# Patient Record
Sex: Male | Born: 1980 | Race: White | Hispanic: No | Smoking: Current every day smoker
Health system: Southern US, Community
[De-identification: ages and names within clinical notes are randomized; demographics above are authoritative.]

## PROBLEM LIST (undated history)

## (undated) DIAGNOSIS — S39012A Strain of muscle, fascia and tendon of lower back, initial encounter: Secondary | ICD-10-CM

## (undated) DIAGNOSIS — F4323 Adjustment disorder with mixed anxiety and depressed mood: Secondary | ICD-10-CM

## (undated) DIAGNOSIS — T50901A Poisoning by unspecified drugs, medicaments and biological substances, accidental (unintentional), initial encounter: Secondary | ICD-10-CM

## (undated) HISTORY — DX: Strain of muscle, fascia and tendon of lower back, initial encounter: S39.012A

## (undated) HISTORY — PX: KNEE SURGERY: SHX244

## (undated) HISTORY — DX: Poisoning by unspecified drugs, medicaments and biological substances, accidental (unintentional), initial encounter: T50.901A

## (undated) HISTORY — DX: Adjustment disorder with mixed anxiety and depressed mood: F43.23

---
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
## 1998-01-28 ENCOUNTER — Emergency Department (HOSPITAL_COMMUNITY): Admission: EM | Admit: 1998-01-28 | Discharge: 1998-01-28 | Payer: Self-pay | Admitting: Emergency Medicine

## 1998-04-12 ENCOUNTER — Inpatient Hospital Stay (HOSPITAL_COMMUNITY): Admission: EM | Admit: 1998-04-12 | Discharge: 1998-04-16 | Payer: Self-pay

## 1998-04-13 ENCOUNTER — Encounter: Payer: Self-pay | Admitting: Internal Medicine

## 1998-04-16 ENCOUNTER — Encounter: Payer: Self-pay | Admitting: Internal Medicine

## 1998-07-05 ENCOUNTER — Encounter: Payer: Self-pay | Admitting: Internal Medicine

## 1998-11-17 ENCOUNTER — Encounter: Payer: Self-pay | Admitting: Emergency Medicine

## 1998-11-17 ENCOUNTER — Emergency Department (HOSPITAL_COMMUNITY): Admission: EM | Admit: 1998-11-17 | Discharge: 1998-11-17 | Payer: Self-pay | Admitting: Emergency Medicine

## 1999-05-02 ENCOUNTER — Encounter: Payer: Self-pay | Admitting: Orthopedic Surgery

## 1999-05-02 ENCOUNTER — Ambulatory Visit (HOSPITAL_COMMUNITY): Admission: RE | Admit: 1999-05-02 | Discharge: 1999-05-02 | Payer: Self-pay | Admitting: Orthopedic Surgery

## 1999-05-04 ENCOUNTER — Emergency Department (HOSPITAL_COMMUNITY): Admission: EM | Admit: 1999-05-04 | Discharge: 1999-05-04 | Payer: Self-pay | Admitting: Emergency Medicine

## 1999-07-18 ENCOUNTER — Emergency Department (HOSPITAL_COMMUNITY): Admission: EM | Admit: 1999-07-18 | Discharge: 1999-07-18 | Payer: Self-pay | Admitting: Emergency Medicine

## 2000-07-16 ENCOUNTER — Emergency Department (HOSPITAL_COMMUNITY): Admission: EM | Admit: 2000-07-16 | Discharge: 2000-07-17 | Payer: Self-pay | Admitting: Emergency Medicine

## 2000-10-05 ENCOUNTER — Emergency Department (HOSPITAL_COMMUNITY): Admission: EM | Admit: 2000-10-05 | Discharge: 2000-10-05 | Payer: Self-pay | Admitting: Emergency Medicine

## 2000-10-05 ENCOUNTER — Encounter: Payer: Self-pay | Admitting: Emergency Medicine

## 2000-10-28 ENCOUNTER — Emergency Department (HOSPITAL_COMMUNITY): Admission: EM | Admit: 2000-10-28 | Discharge: 2000-10-28 | Payer: Self-pay | Admitting: Emergency Medicine

## 2000-10-30 ENCOUNTER — Emergency Department (HOSPITAL_COMMUNITY): Admission: EM | Admit: 2000-10-30 | Discharge: 2000-10-30 | Payer: Self-pay | Admitting: Emergency Medicine

## 2002-04-24 ENCOUNTER — Emergency Department (HOSPITAL_COMMUNITY): Admission: EM | Admit: 2002-04-24 | Discharge: 2002-04-24 | Payer: Self-pay

## 2002-09-06 ENCOUNTER — Emergency Department (HOSPITAL_COMMUNITY): Admission: EM | Admit: 2002-09-06 | Discharge: 2002-09-06 | Payer: Self-pay | Admitting: Emergency Medicine

## 2002-11-25 ENCOUNTER — Emergency Department (HOSPITAL_COMMUNITY): Admission: AD | Admit: 2002-11-25 | Discharge: 2002-11-25 | Payer: Self-pay | Admitting: Emergency Medicine

## 2003-05-27 ENCOUNTER — Emergency Department (HOSPITAL_COMMUNITY): Admission: EM | Admit: 2003-05-27 | Discharge: 2003-05-27 | Payer: Self-pay | Admitting: *Deleted

## 2003-10-18 ENCOUNTER — Emergency Department (HOSPITAL_COMMUNITY): Admission: EM | Admit: 2003-10-18 | Discharge: 2003-10-18 | Payer: Self-pay | Admitting: Emergency Medicine

## 2004-06-20 ENCOUNTER — Emergency Department (HOSPITAL_COMMUNITY): Admission: EM | Admit: 2004-06-20 | Discharge: 2004-06-20 | Payer: Self-pay | Admitting: Emergency Medicine

## 2004-09-14 ENCOUNTER — Emergency Department (HOSPITAL_COMMUNITY): Admission: EM | Admit: 2004-09-14 | Discharge: 2004-09-15 | Payer: Self-pay | Admitting: Emergency Medicine

## 2004-11-14 ENCOUNTER — Emergency Department (HOSPITAL_COMMUNITY): Admission: EM | Admit: 2004-11-14 | Discharge: 2004-11-14 | Payer: Self-pay | Admitting: Emergency Medicine

## 2005-08-04 ENCOUNTER — Emergency Department (HOSPITAL_COMMUNITY): Admission: EM | Admit: 2005-08-04 | Discharge: 2005-08-04 | Payer: Self-pay | Admitting: Family Medicine

## 2006-03-12 ENCOUNTER — Emergency Department (HOSPITAL_COMMUNITY): Admission: EM | Admit: 2006-03-12 | Discharge: 2006-03-12 | Payer: Self-pay | Admitting: Family Medicine

## 2007-05-08 ENCOUNTER — Emergency Department (HOSPITAL_COMMUNITY): Admission: EM | Admit: 2007-05-08 | Discharge: 2007-05-08 | Payer: Self-pay | Admitting: Emergency Medicine

## 2007-05-22 ENCOUNTER — Emergency Department (HOSPITAL_COMMUNITY): Admission: EM | Admit: 2007-05-22 | Discharge: 2007-05-22 | Payer: Self-pay | Admitting: Emergency Medicine

## 2007-09-29 ENCOUNTER — Ambulatory Visit (HOSPITAL_COMMUNITY): Admission: RE | Admit: 2007-09-29 | Discharge: 2007-09-29 | Payer: Self-pay | Admitting: Orthopedic Surgery

## 2008-01-18 ENCOUNTER — Emergency Department (HOSPITAL_COMMUNITY): Admission: EM | Admit: 2008-01-18 | Discharge: 2008-01-18 | Payer: Self-pay | Admitting: Emergency Medicine

## 2008-04-05 ENCOUNTER — Emergency Department: Payer: Self-pay | Admitting: Emergency Medicine

## 2008-04-16 ENCOUNTER — Emergency Department (HOSPITAL_COMMUNITY): Admission: EM | Admit: 2008-04-16 | Discharge: 2008-04-16 | Payer: Self-pay | Admitting: Emergency Medicine

## 2008-09-14 ENCOUNTER — Encounter: Admission: RE | Admit: 2008-09-14 | Discharge: 2008-09-14 | Payer: Self-pay | Admitting: Family Medicine

## 2008-10-15 ENCOUNTER — Emergency Department (HOSPITAL_COMMUNITY): Admission: EM | Admit: 2008-10-15 | Discharge: 2008-10-15 | Payer: Self-pay | Admitting: Emergency Medicine

## 2008-10-22 ENCOUNTER — Emergency Department (HOSPITAL_COMMUNITY): Admission: EM | Admit: 2008-10-22 | Discharge: 2008-10-22 | Payer: Self-pay | Admitting: Emergency Medicine

## 2008-10-25 ENCOUNTER — Emergency Department (HOSPITAL_COMMUNITY): Admission: EM | Admit: 2008-10-25 | Discharge: 2008-10-25 | Payer: Self-pay | Admitting: *Deleted

## 2009-03-04 ENCOUNTER — Ambulatory Visit: Payer: Self-pay | Admitting: Diagnostic Radiology

## 2009-03-04 ENCOUNTER — Emergency Department (HOSPITAL_BASED_OUTPATIENT_CLINIC_OR_DEPARTMENT_OTHER): Admission: EM | Admit: 2009-03-04 | Discharge: 2009-03-04 | Payer: Self-pay | Admitting: Emergency Medicine

## 2009-05-05 ENCOUNTER — Inpatient Hospital Stay (HOSPITAL_COMMUNITY): Admission: RE | Admit: 2009-05-05 | Discharge: 2009-05-06 | Payer: Self-pay | Admitting: Psychiatry

## 2009-05-05 ENCOUNTER — Encounter: Payer: Self-pay | Admitting: Emergency Medicine

## 2009-05-05 ENCOUNTER — Ambulatory Visit: Payer: Self-pay | Admitting: Psychiatry

## 2009-09-11 ENCOUNTER — Emergency Department (HOSPITAL_COMMUNITY): Admission: EM | Admit: 2009-09-11 | Discharge: 2009-09-11 | Payer: Self-pay | Admitting: Emergency Medicine

## 2009-09-14 ENCOUNTER — Emergency Department (HOSPITAL_COMMUNITY): Admission: EM | Admit: 2009-09-14 | Discharge: 2009-09-14 | Payer: Self-pay | Admitting: Emergency Medicine

## 2009-09-23 ENCOUNTER — Ambulatory Visit: Payer: Self-pay | Admitting: Diagnostic Radiology

## 2009-09-23 ENCOUNTER — Emergency Department (HOSPITAL_BASED_OUTPATIENT_CLINIC_OR_DEPARTMENT_OTHER): Admission: EM | Admit: 2009-09-23 | Discharge: 2009-09-23 | Payer: Self-pay | Admitting: Emergency Medicine

## 2009-12-29 ENCOUNTER — Emergency Department (HOSPITAL_COMMUNITY): Admission: EM | Admit: 2009-12-29 | Discharge: 2009-12-29 | Payer: Self-pay | Admitting: Family Medicine

## 2010-01-04 ENCOUNTER — Emergency Department (HOSPITAL_COMMUNITY): Admission: EM | Admit: 2010-01-04 | Discharge: 2010-01-04 | Payer: Self-pay | Admitting: Emergency Medicine

## 2010-05-05 ENCOUNTER — Ambulatory Visit: Payer: Self-pay | Admitting: Internal Medicine

## 2010-05-05 ENCOUNTER — Encounter: Payer: Self-pay | Admitting: Internal Medicine

## 2010-05-05 DIAGNOSIS — M79609 Pain in unspecified limb: Secondary | ICD-10-CM

## 2010-05-05 DIAGNOSIS — M25519 Pain in unspecified shoulder: Secondary | ICD-10-CM

## 2010-05-05 DIAGNOSIS — J301 Allergic rhinitis due to pollen: Secondary | ICD-10-CM

## 2010-05-05 DIAGNOSIS — F4323 Adjustment disorder with mixed anxiety and depressed mood: Secondary | ICD-10-CM

## 2010-05-06 ENCOUNTER — Telehealth: Payer: Self-pay | Admitting: Licensed Clinical Social Worker

## 2010-05-20 ENCOUNTER — Telehealth: Payer: Self-pay | Admitting: Internal Medicine

## 2010-05-20 ENCOUNTER — Encounter: Payer: Self-pay | Admitting: Internal Medicine

## 2010-05-20 ENCOUNTER — Ambulatory Visit
Admission: RE | Admit: 2010-05-20 | Discharge: 2010-05-20 | Payer: Self-pay | Source: Home / Self Care | Attending: Internal Medicine | Admitting: Internal Medicine

## 2010-05-20 DIAGNOSIS — T50901A Poisoning by unspecified drugs, medicaments and biological substances, accidental (unintentional), initial encounter: Secondary | ICD-10-CM | POA: Insufficient documentation

## 2010-05-20 LAB — CONVERTED CEMR LAB: Acetaminophen (Tylenol), Serum: <10 ug/mL — ABNORMAL LOW (ref 10–30)

## 2010-05-21 ENCOUNTER — Encounter: Payer: Self-pay | Admitting: Internal Medicine

## 2010-05-22 ENCOUNTER — Ambulatory Visit: Admission: RE | Admit: 2010-05-22 | Discharge: 2010-05-22 | Payer: Self-pay | Source: Home / Self Care

## 2010-06-10 ENCOUNTER — Telehealth: Payer: Self-pay | Admitting: Licensed Clinical Social Worker

## 2010-06-18 ENCOUNTER — Ambulatory Visit: Admission: RE | Admit: 2010-06-18 | Discharge: 2010-06-18 | Payer: Self-pay | Source: Home / Self Care

## 2010-06-18 DIAGNOSIS — S335XXA Sprain of ligaments of lumbar spine, initial encounter: Secondary | ICD-10-CM | POA: Insufficient documentation

## 2010-06-20 ENCOUNTER — Telehealth: Payer: Self-pay | Admitting: Internal Medicine

## 2010-06-20 ENCOUNTER — Ambulatory Visit: Admit: 2010-06-20 | Payer: Self-pay

## 2010-06-26 NOTE — Assessment & Plan Note (Signed)
Summary: NEW PT TO CLINIC AND MD/CH   Vital Signs:  Patient profile:   30 year old male Height:      66 inches Weight:      184.1 pounds (83.68 kg) BMI:     29.82 Temp:     96.9 degrees F (36.06 degrees C) oral Pulse rate:   93 / minute BP sitting:   117 / 69  Vitals Entered By: Cynda Familia Duncan Dull) (May 05, 2010 1:32 PM) CC: new to clinic Pain Assessment Patient in pain? yes     Location: left shoulder Intensity: 6 Type: aching Onset of pain  long time Nutritional Status BMI of 25 - 29 = overweight Nutritional Status Detail appetite varies  Have you ever been in a relationship where you felt threatened, hurt or afraid?No   Does patient need assistance? Functional Status Self care Ambulation Normal   CC:  new to clinic.  History of Present Illness: 30 yo man without significant PMH presents today for anxiety.  He is under a lot of stress, has 5 kids of his own and has to take care of his father, grandfather, and sister. His father has lung cancer. He ruptured his tendon on his left leg back in august 2011 and he evaluated by Dr. Brynda Greathouse once and was told to come back to see physical therapy and also has chronic his left shoulder pain.  Patient's job requires him to lift his arm therefore he damaged his tendons.  He was supposed to return for an MRI but again stated that he did not have time.  He has been taken aleve, goody powder, asprin with minimal  relief.  He only gets 4-5 hours of sleep per night.   At times he feels depressed, denies SI/HI, he does want to sleep and feel tired all the time, low energy level.   Depression History:      The patient denies a depressed mood most of the day and a diminished interest in his usual daily activities.         Preventive Screening-Counseling & Management  Alcohol-Tobacco     Smoking Status: current     Smoking Cessation Counseling: yes     Packs/Day: 0.5  Allergies (verified): No Known Drug Allergies  Social  History: Smoking Status:  current Packs/Day:  0.5  Physical Exam  General:  alert, well-developed, well-nourished, and well-hydrated.   Nose:  clear nasal discharge and mild erythema of nasal mucosa Lungs:  normal respiratory effort, no intercostal retractions, no accessory muscle use, normal breath sounds, no crackles, and no wheezes.   Heart:  normal rate, regular rhythm, no murmur, no gallop, no rub, and no JVD.   Abdomen:  soft, non-tender, normal bowel sounds, no distention, no masses, no guarding, and no rebound tenderness.   Msk:  left shoulder:+ crepitus, tenderness, FROM left knee: tenderness on palpation, full ROM, no effusion on knee joint Pulses:  R and L carotid,radial,femoral,dorsalis pedis and posterior tibial pulses are full and equal bilaterally Extremities:  No clubbing, cyanosis, edema, or deformity noted with normal full range of motion of all joints.   Neurologic:  alert & oriented X3, cranial nerves II-XII intact, strength normal in all extremities, and sensation intact to light touch.   Psych:  moderately anxious and judgment fair.  Denies any SI/HI   Impression & Recommendations:  Problem # 1:  ADJ DISORDER WITH MIXED ANXIETY & DEPRESSED MOOD (ICD-309.28) Patient is going through a lot of stress in his life right  now given that he has to take care of his father who has lung cancer and his grandfather, sister, and 5 children of his own.  Patient does not want to be on medications long-term.  He said he tried Paxil and Prozac in the past without any success.   Will prescribe Celexa 20mg  by mouth once daily. Will start Klonopin 0.5mg  by mouth once daily  Will refer to social worker for help with social issues  Orders: Social Work Referral (Social )  Problem # 2:  SHOULDER PAIN, LEFT (ICD-719.41) Will refer patient back to sport medicine, Dr. Brynda Greathouse. He said he was given Vicodin by Dr. Brynda Greathouse but we will prescribe Mobic 15mg  by mouth once daily.  Will not give  narcotics at this time.  I would like for patient to try a trial of NSAIDs first.      His updated medication list for this problem includes:    Mobic 15 Mg Tabs (Meloxicam) .[name]... Take 1 tablet by mouth for pain  Problem # 3:  LEG PAIN, LEFT (ICD-729.5) Will prescribe Mobic for his pain while waiting to see Dr. Brynda Greathouse.  He has already seen sport medicine in the past and he cancelled his appointments because he did not have time.   I advised him to go back for treatment with Dr. Brynda Greathouse.  Orders: Social Work Referral (Social )  Problem # 4:  Preventive Health Care (ICD-V70.0) Flu vaccine given  today  Problem # 5:  ALLERGIC RHINITIS, SEASONAL (ICD-477.0) Runny nose, erythematous nasal mucosa Will refill Flonase  Complete Medication List: 1)  Celexa 20 Mg Tabs (Citalopram hydrobromide) .... Take 1 tablet by mouth daily for depression and anxiety 2)  Mobic 15 Mg Tabs (Meloxicam) .... Take 1 tablet by mouth for pain 3)  Klonopin 0.5 Mg Tabs (Clonazepam) .... Take 1 tablet by mouth once daily for anxiety 4)  Flonase 50 Mcg/act Susp (Fluticasone propionate) .... 2 sprays in each nostril daily for allergy/runny nose/congestion  Patient Instructions: 1)  Take Celexa 20mg  by mouth once daily for your depression and anxiety attacks, it will take several weeks for the medication to work, if you start feeling more depressed or suicidal, please stop the medication and call our office or come in to be evaluated 2)  Take Klonopin 0.5mg  by mouth once daily for your anxiety 3)  Take Mobic 15mg  by mouth once daily for your shoulder and leg pain 4)  Make sure you call and make appointment with Dr. Brynda Greathouse- Sport Medicine 5)  Will refer you to social work- Dorothe Pea will call you 6)  Please follow up in 2-4 weeks-[facility] Prescriptions: FLONASE 50 MCG/ACT SUSP (FLUTICASONE PROPIONATE) 2 sprays in each nostril daily for allergy/runny nose/congestion  #1 x 1   Entered and  Authorized by:   Rosana Berger MD   Signed by:   Rosana Berger MD on 05/05/2010   Method used:   Print then Give to Patient   RxID:   [id number] CELEXA 20 MG TABS (CITALOPRAM HYDROBROMIDE) take 1 tablet by mouth daily for depression and anxiety  #30 x 3   Entered and Authorized by:   Rosana Berger MD   Signed by:   Rosana Berger MD on 05/05/2010   Method used:   Print then Give to Patient   RxID:   [id number] MOBIC 15 MG TABS (MELOXICAM) take 1 tablet by mouth for pain  #30 x 1   Entered and Authorized by:   Rosana Berger MD  Signed by:   Rosana Berger MD on 05/05/2010   Method used:   Print then Give to Patient   RxID:   [id number] KLONOPIN 0.5 MG TABS (CLONAZEPAM) take 1 tablet by mouth once daily for anxiety  #30 x 1   Entered and Authorized by:   Rosana Berger MD   Signed by:   Rosana Berger MD on 05/05/2010   Method used:   Print then Give to Patient   RxID:   [id number] FLONASE 50 MCG/ACT SUSP (FLUTICASONE PROPIONATE) 2 sprays in each nostril daily for allergy/runny nose/congestion  #1 x 1   Entered and Authorized by:   Rosana Berger MD   Signed by:   Rosana Berger MD on 05/05/2010   Method used:   Electronically to        [facility]* (retail)       [address] [place], [place]  [address]       Ph: [phone] or [phone]       Fax: [phone]   RxID:   [id number] KLONOPIN 0.5 MG TABS (CLONAZEPAM) take 1 tablet by mouth once daily for anxiety  #30 x 1   Entered and Authorized by:   Rosana Berger MD   Signed by:   Rosana Berger MD on 05/05/2010   Method used:   Print then Give to Patient   RxID:   [id number] MOBIC 15 MG TABS (MELOXICAM) take 1 tablet by mouth for pain  #30 x 1   Entered and Authorized by:   Rosana Berger MD   Signed by:   Rosana Berger MD on 05/05/2010   Method used:   Electronically to        [facility]* (retail)       [address] [place], [place]  [address]       Ph: [phone] or [phone]       Fax: [phone]   RxID:   [id number] CELEXA 20 MG TABS (CITALOPRAM HYDROBROMIDE) take 1 tablet by mouth daily for depression and anxiety  #30 x 3   Entered and Authorized by:   Rosana Berger MD   Signed by:   Rosana Berger MD on 05/05/2010   Method used:   Electronically to        [facility]* (retail)       [address] [place], [place]  [address]       Ph: [phone] or [phone]       Fax: [phone]   RxID:   [id number]    Orders Added: 1)  Social Work Referral Lelon Mast ] 2)  Est. Patient Level III [phone]

## 2010-06-26 NOTE — Miscellaneous (Signed)
Summary: Pain medication  Clinical Lists Changes Reviewed labs. Liver function tests are normal and tylenol level neg.Will give him short course of vicodin for his chronic knee pain from a meniscus tear and refer him to orthopedics for management. Would like for him stop using Goody's powder immediately. I will add on an NSAID when I re-evaluate him.   Medications: Added new medication of VICODIN 5-500 MG TABS (HYDROCODONE-ACETAMINOPHEN) Take 1 tablet by mouth every 6 hours as needed for pain - Signed Added new medication of VOLTAREN 1 % GEL (DICLOFENAC SODIUM) Apply to painful joints as directed two times a day - Signed Rx of VICODIN 5-500 MG TABS (HYDROCODONE-ACETAMINOPHEN) Take 1 tablet by mouth every 6 hours as needed for pain;  #60 x 0;  Signed;  Entered by: Julaine Fusi  DO;  Authorized by: Julaine Fusi  DO;  Method used: Telephoned to [facility]*, [address], [place], [place]  [address], Ph: [phone] or [phone], Fax: [phone] Rx of VOLTAREN 1 % GEL (DICLOFENAC SODIUM) Apply to painful joints as directed two times a day;  #1 tube x 3;  Signed;  Entered by: Julaine Fusi  DO;  Authorized by: Julaine Fusi  DO;  Method used: Telephoned to [facility]*, [address], [place], [place]  [address], Ph: [phone] or [phone], Fax: [phone]    Prescriptions: VOLTAREN 1 % GEL (DICLOFENAC SODIUM) Apply to painful joints as directed two times a day  #1 tube x 3   Entered and Authorized by:   Julaine Fusi  DO   Signed by:   Julaine Fusi  DO on 05/21/2010   Method used:   Telephoned to ...       [facility]* (retail)       [address] [place], [place]  [address]       Ph: [phone] or [phone]       Fax: [phone]   RxID:   [id number] VICODIN 5-500 MG TABS (HYDROCODONE-ACETAMINOPHEN) Take 1 tablet by  mouth every 6 hours as needed for pain  #60 x 0   Entered and Authorized by:   Julaine Fusi  DO   Signed by:   Julaine Fusi  DO on 05/21/2010   Method used:   Telephoned to ...       [facility]* (retail)       [address] [place], [place]  [address]       Ph: [phone] or [phone]       Fax: [phone]   RxID:   [id number]  Rx called in Merrie Roof RN  May 21, 2010 12:03 PM

## 2010-06-26 NOTE — Progress Notes (Addendum)
Summary: Soc. Worl F/U  Phone Note [employer] of Call: Phone counseling.  30 minutes.  Call to check in with Maisie Fus and make sure he is okay coping with his father's hospitalizations and terminal illness.  Listened to patient's story.  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  We discussed [employer] and how to approach getting his father to agree to go there.  Praised patient for assisting his father as best he can as father deals with lung cancer.   Patient said he is doing much better since getting off antidepressants.   Father is at [facility] right now and he and his sister will try to get him to go to [facility] because he had a fall at home last week. Daichi said his father is very stubborn and wants to be at home but they can no longer manage his care and it is dangerous and overwhelming.   Channin said the doctor's have told his father that the cancer has spread.  Ramone said that his father is still eating and smoking at home.   Gave pt my hours and encouraged him to call me for advice and problem-solving.   I told him we will remain in touch.

## 2010-06-26 NOTE — Progress Notes (Signed)
Summary: Soc. Work  Programme researcher, [employer] of Call: Spoke with Holley about coming in for appmt to visit with me.   He will call me back.  He thinks his dad has appmt on Thursday and that may be a good time.

## 2010-06-26 NOTE — Assessment & Plan Note (Signed)
Summary: depression/gg   Vital Signs:  Patient profile:   30 year old male Height:      66 inches (167.64 cm) Weight:      178.3 pounds (81.05 kg) BMI:     28.88 Temp:     97.5 degrees F (36.39 degrees C) oral Pulse rate:   98 / minute BP sitting:   118 / 79  (left arm)  Vitals Entered By: Stanton Kidney Ditzler RN (May 22, 2010 2:18 PM) Is Patient Diabetic? No Pain Assessment Patient in pain? no      Nutritional Status BMI of 25 - 29 = overweight Nutritional Status Detail appetite fair  Have you ever been in a relationship where you felt threatened, hurt or afraid?denies   Does patient need assistance? Functional Status Self care Ambulation Normal Comments Talk - father wants to go home. Tried of fighting the system.   History of Present Illness: 30 yo male with PMH outlined below present to [facility] with main concern of anxiety given his difficult situation at home with his father who is dying. He has been on antidepressant but reports not tolerating it welll and has tries to overdose on it. He is tolerating valium well and would like to get refill. No SI or HI.   Depression History:      The patient denies a depressed mood most of the day and a diminished interest in his usual daily activities.  The patient denies significant weight loss, significant weight gain, insomnia, hypersomnia, psychomotor agitation, psychomotor retardation, fatigue (loss of energy), feelings of worthlessness (guilt), impaired concentration (indecisiveness), and recurrent thoughts of death or suicide.        The patient denies that he feels like life is not worth living, denies that he wishes that he were dead, and denies that he has thought about ending his life.         Preventive Screening-Counseling & Management  Alcohol-Tobacco     Smoking Status: current     Smoking Cessation Counseling: yes     Packs/Day: 0.5  Problems Prior to Update: 1)  Drug Overdose  (ICD-977.9) 2)  Allergic  Rhinitis, Seasonal  (ICD-477.0) 3)  Leg Pain, Left  (ICD-729.5) 4)  Shoulder Pain, Left  (ICD-719.41) 5)  Adj Disorder With Mixed Anxiety & Depressed Mood  (ICD-309.28)  Medications Prior to Update: 1)  Flonase 50 Mcg/act Susp (Fluticasone Propionate) .... 2 Sprays in Each Nostril Daily For Allergy/runny Nose/congestion 2)  Valium 5 Mg Tabs (Diazepam) .... Take 1 Tablet By Mouth Two Times A Day As Needed For Severe Anxiety and Agitation 3)  Vicodin 5-500 Mg Tabs (Hydrocodone-Acetaminophen) .... Take 1 Tablet By Mouth Every 6 Hours As Needed For Pain 4)  Voltaren 1 % Gel (Diclofenac Sodium) .... Apply To Painful Joints As Directed Two Times A Day  Current Medications (verified): 1)  Flonase 50 Mcg/act Susp (Fluticasone Propionate) .... 2 Sprays in Each Nostril Daily For Allergy/runny Nose/congestion 2)  Valium 5 Mg Tabs (Diazepam) .... Take 1 Tablet By Mouth Two Times A Day As Needed For Severe Anxiety and Agitation 3)  Vicodin 5-500 Mg Tabs (Hydrocodone-Acetaminophen) .... Take 1 Tablet By Mouth Every 6 Hours As Needed For Pain 4)  Voltaren 1 % Gel (Diclofenac Sodium) .... Apply To Painful Joints As Directed Two Times A Day  Allergies: 1)  ! Celexa (Citalopram Hydrobromide)  Past History:  Risk Factors: Smoking Status: current (05/22/2010) Packs/Day: 0.5 (05/22/2010)  Review of Systems  per HPI  Physical Exam  General:  Well-developed,well-nourished,in no acute distress; alert,appropriate and cooperative throughout examination Lungs:  normal respiratory effort, no intercostal retractions, no accessory muscle use, normal breath sounds, no crackles, and no wheezes.   Heart:  normal rate, regular rhythm, no murmur, no gallop, no rub, and no JVD.   Psych:  Cognition and judgment appear intact. Alert and cooperative with normal attention span and concentration. No apparent delusions, illusions, hallucinations   Impression & Recommendations:  Problem # 1:  ADJ DISORDER WITH  MIXED ANXIETY & DEPRESSED MOOD (ICD-309.28) Discussed with Dr. Phillips Odor and will not continue antidepressant medicaiton but will allow refill on valium and will cont to follow up on control of his anxiety. I have spent an hour consouling the pt about going thrpugh the difficult time with his father.   Complete Medication List: 1)  Flonase 50 Mcg/act Susp (Fluticasone propionate) .... 2 sprays in each nostril daily for allergy/runny nose/congestion 2)  Vicodin 5-500 Mg Tabs (Hydrocodone-acetaminophen) .... Take 1 tablet by mouth every 6 hours as needed for pain 3)  Voltaren 1 % Gel (Diclofenac sodium) .... Apply to painful joints as directed two times a day  Patient Instructions: 1)  Please schedule a follow-up appointment in 6 months. Prescriptions: VICODIN 5-500 MG TABS (HYDROCODONE-ACETAMINOPHEN) Take 1 tablet by mouth every 6 hours as needed for pain  #60 x 0   Entered and Authorized by:   Mliss Sax MD   Signed by:   Mliss Sax MD on 05/22/2010   Method used:   Print then Give to Patient   RxID:   [id number] VALIUM 5 MG TABS (DIAZEPAM) Take 1 tablet by mouth two times a day as needed for severe anxiety and agitation  #60 x 0   Entered and Authorized by:   Mliss Sax MD   Signed by:   Mliss Sax MD on 05/22/2010   Method used:   Print then Give to Patient   RxID:   [id number]    Orders Added: 1)  Est. Patient Level II [47425]   Immunization History:  Influenza Immunization History:    Influenza:  state fluvax 3 (05/05/2010)   Immunization History:  Influenza Immunization History:    Influenza:  State Fluvax 3 (05/05/2010)   Prevention & Chronic Care Immunizations   Influenza vaccine: State Fluvax 3  (05/05/2010)    Tetanus booster: Not documented    Pneumococcal vaccine: Not documented  Other Screening   Smoking status: current  (05/22/2010)   Smoking cessation counseling: yes  (05/22/2010)

## 2010-06-26 NOTE — Progress Notes (Signed)
Summary: walk-in/gg  Phone Note Call from Patient   Caller: Patient Summary of Call: Pt walked into clinic stating the meds he is on is making him angery.  He states he tried to kill himself a few nights ago by taking tylenol.  He ended up vomiting after that.  He is depressed because his Dad is dying. He feels vicodin and valium helps him much more that all the meds he was put on. He now feels rage.  Called Dr Phillips Odor in to talk with pt.  Labs drawn, Rx given per Dr Phillips Odor.  Pt will return for f/u on Thursday.  He was given contact #'s for Resident on call and [facility]  When d/c pt states he feels much better, no thoughts of suicide or harming others.  ( 1700) Initial call taken by: Merrie Roof RN,  May 20, 2010 4:19 PM  Follow-up for Phone Call        Spoke with patient at length today regarding his depression. Worsening symptoms when he started Celexa- became suicidal. Last dose of Celexa was 12/24. I advised him to stop this medication immediately. I also recommended hospitalization at [facility] but he refused and is not committable at present. He did however, reported a near OD on Tylenol 12/24 PM-but stopped him self and induced vomiting. He has no suicidal or homicidal ideation at this moment. Clearly articulates hope for the future today. No substance abuse is involved. There are no firearms in his home. His father is currently on 2900-hospice patient with complex decision making. Mark Gentry reports a sense of panic today and is asking for help. I have agreed to give him 6 tablets of diazapam and will check him again on Thursday. I advised him to be seen [facility] at [facility]. Will forward to Lorri Frederick. for assistance-he needs intense psychiatric care now and will in the future-especially if his father dies. Please feel free to contact me for additional information about my discussion with the patient. Follow-up by: Julaine Fusi  DO,  May 20, 2010 9:06 PM  New  Problems: DRUG OVERDOSE (ICD-977.9)  New Allergies: ! CELEXA (CITALOPRAM HYDROBROMIDE) New Problems: DRUG OVERDOSE (ICD-977.9) New/Updated Medications: VALIUM 5 MG TABS (DIAZEPAM) Take 1 tablet by mouth two times a day as needed for severe anxiety and agitation New Allergies: ! CELEXA (CITALOPRAM HYDROBROMIDE)Prescriptions: VALIUM 5 MG TABS (DIAZEPAM) Take 1 tablet by mouth two times a day as needed for severe anxiety and agitation  #6 x 0   Entered and Authorized by:   Julaine Fusi  DO   Signed by:   Julaine Fusi  DO on 05/20/2010   Method used:   Print then Give to Patient   RxID:   [id number]   Process Orders Check Orders Results:     [facility]: Order checked:     [name] ATTENDING DESKTOP NOT AUTHORIZED TO ORDER Requisition page opened for [facility]. Order transmitted to EMR-Link May 20, 2010 4:48 PM  Tests Sent for requisitioning (May 21, 2010 9:57 AM):     05/20/2010: [facility] -- T-Comprehensive Metabolic Panel [[id number]] (signed)     05/20/2010: [facility] -- T- * Misc. Laboratory test [phone] (signed)

## 2010-06-26 NOTE — Miscellaneous (Signed)
Summary: PATIENT CONSENT FORM  PATIENT CONSENT FORM   Imported By: Louretta Parma 05/22/2010 16:26:40  _____________________________________________________________________  External Attachment:    Type:   Image     Comment:   External Document

## 2010-06-26 NOTE — Progress Notes (Addendum)
Summary: Refill/gh  Phone Note Refill Request Message from:  Fax from Pharmacy on June 20, 2010 12:03 PM  Refills Requested: Medication #1:  DIAZEPAM 5 MG TABS Take 1 tablet by mouth two times a day as needed anxiety.   Last Refilled: 05/21/2010 Lst offfice visit was 06/18/2010.   Method Requested: Fax to Local Pharmacy Initial call taken by: Angelina Ok RN,  June 20, 2010 12:04 PM  Follow-up for Phone Call        Rx denied because already done. Follow-up by: Ulyess Mort MD,  June 20, 2010 2:19 PM

## 2010-07-02 NOTE — Assessment & Plan Note (Signed)
Summary: anxiety/pcp-ho/hla   Vital Signs:  Patient profile:   30 year old male Height:      66 inches (167.64 cm) Weight:      175.9 pounds (79.95 kg) BMI:     28.49 Temp:     97.3 degrees F (36.28 degrees C) oral Pulse rate:   87 / minute BP sitting:   131 / 77  (right arm) Cuff size:   regular  Vitals Entered By: Theotis Barrio NT II (June 18, 2010 2:21 PM) CC: ANXIETY ( HIS FATHER - Tulio Yniguez ) IN [facility]  Is Patient Diabetic? No Pain Assessment Patient in pain? yes     Location: back Intensity:    8 Type:  SHARP/BURN Onset of pain  FOR ABOUT A MONTH Nutritional Status BMI of 25 - 29 = overweight  Have you ever been in a relationship where you felt threatened, hurt or afraid?No   Does patient need assistance? Functional Status Self care Ambulation Normal   CC:  ANXIETY ( HIS FATHER - Givanni Meister ) IN HOSPIC .  History of Present Illness: Pt is a 30 y/o M with PMH outlined in the EMR who presents today for med refill.  He reports left back and hip pain with pain radiating down his left leg that began after lifting his father off of the ground about 1 week ago.  He frequently assists the hospital staff with moving and bathing his father.  He notes the pain radiating down his leg is intermittent and aggravated by movement.  He denies any weakness, numbness, fever, chills, urinary/bowel incontinence or urinary/bowel retention.   He is experiencing increased stress 2/2 his fathers illness and problems with his sister.  He states his fathers health has quickly deteriorated over the past week and he may not survive the next few days.  Pt is trying to come to terms with his father's illness and is feeling very stressed with the responsibility of caring for his father, preparing funeral/estate arrangements, caring for his family at home, etc.  He is experiencing conflict with his sister who is having some psychiatric difficulties and problems with drug use.  He reports  significantly increased anxiety levels and requests to resume tx with benzos,.  He denies any SI/HI.  Preventive Screening-Counseling & Management  Alcohol-Tobacco     Smoking Status: current     Smoking Cessation Counseling: yes     Packs/Day: 0.5  Caffeine-Diet-Exercise     Does Patient Exercise: no  Current Medications (verified): 1)  Flonase 50 Mcg/act Susp (Fluticasone Propionate) .... 2 Sprays in Each Nostril Daily For Allergy/runny Nose/congestion 2)  Hydrocodone-Acetaminophen 10-500 Mg Tabs (Hydrocodone-Acetaminophen) .... Take One Pill Every 4 Hours As Needed For Pain 3)  Voltaren 1 % Gel (Diclofenac Sodium) .... Apply To Painful Joints As Directed Two Times A Day 4)  Diazepam 5 Mg Tabs (Diazepam) .... Take 1 Tablet By Mouth Two Times A Day As Needed Anxiety  Allergies (verified): 1)  ! Celexa (Citalopram Hydrobromide)  Past History:  Past medical, surgical, family and social histories (including risk factors) reviewed for relevance to current acute and chronic problems.  Family History: Reviewed history and no changes required.  Social History: Reviewed history and no changes required. Does Patient Exercise:  no  Physical Exam  General:  Well-developed,well-nourished,in no acute distress; alert,appropriate and cooperative throughout examination Head:  normocephalic and atraumatic.   Eyes:  vision grossly intact.  EOMI.  PERRLA.  sclerae anicteric Mouth:  pharynx pink and moist.  Neck:  supple and no masses.   Lungs:  normal respiratory effort, no intercostal retractions, no accessory muscle use, normal breath sounds, no crackles, and no wheezes.   Heart:  normal rate, regular rhythm, no murmur, no gallop, no rub, and no JVD.   Abdomen:  soft, non-tender, normal bowel sounds, no distention, no masses, no guarding, and no rebound tenderness.   Msk:  No TTP to palpation of spinous or transverse processes throughout spine. + tension along bilateral paraspinal  musculature of l-spine, R>L no joint swelling, no joint warmth, no redness over joints, no joint deformities, no joint instability, and no crepitation.   Neurologic:  alert & oriented X3, cranial nerves II-XII intact, strength normal in all extremities, sensation intact to light touch, sensation intact to pinprick, gait normal, DTRs symmetrical and normal, and heel-to-shin normal.   Skin:  turgor normal, color normal, no rashes, and no ecchymoses.   Psych:  Oriented X3, memory intact for recent and remote, normally interactive, good eye contact, not anxious appearing, and not depressed appearing.     Impression & Recommendations:  Problem # 1:  LUMBAR STRAIN, ACUTE (ICD-847.2) Pt likely sustained acute muscle strain after lifting his father off of the ground.  WIll refill his vicodin at increased dose of 7.5mg  as his pain was not alleviated with his previous dose rx'd for his shoulder and leg pain.  Advised pt to apply warm compresses to his low back for additional relief.  Advised him to avoid any additional tylenol while he is taking his vicodin and to use otc NSAIDS for additional pain relief.  Problem # 2:  ADJ DISORDER WITH MIXED ANXIETY & DEPRESSED MOOD (ICD-309.28) Pt feels he is coping well with his fathers death; denies SI/HI.  States he is feeling much better after d/c of celexa but continues to experience anxiety and trouble sleeping.  Will refill pts diazepam today.  Will have him rtc in 39mo to assess his mood.  Pt declined referral to mental health provider today; has already been in touch with hospice and palliative care center re: his father; encouraged him to participate with grief counseling.   Advised pt to contact clinic, go to the ER or call suicide hotline if he has thoughts or plans of self-harm/suicide- pt reassures me he is not suicidal but will contact the clinic, ER, or suicide prevention services if he has suicidal thoughts.  Complete Medication List: 1)  Flonase 50 Mcg/act  Susp (Fluticasone propionate) .... 2 sprays in each nostril daily for allergy/runny nose/congestion 2)  Hydrocodone-acetaminophen 10-500 Mg Tabs (Hydrocodone-acetaminophen) .... Take one pill every 4 hours as needed for pain 3)  Voltaren 1 % Gel (Diclofenac sodium) .... Apply to painful joints as directed two times a day 4)  Diazepam 5 Mg Tabs (Diazepam) .... Take 1 tablet by mouth two times a day as needed anxiety  Patient Instructions: 1)  Please schedule a follow-up appointment in 1 month. 2)  Please call the clinic at [phone] with any questions or concerns. 3)  If you experience fever, chills, worsening pain, weakness, or other concerning problem, please call the clinic asap or go to the ER Prescriptions: HYDROCODONE-ACETAMINOPHEN 10-500 MG TABS (HYDROCODONE-ACETAMINOPHEN) Take one pill every 4 hours as needed for pain  #90 x 0   Entered and Authorized by:   Nelda Bucks DO   Signed by:   Nelda Bucks DO on 06/18/2010   Method used:   Print then Give to Patient   RxID:   [id number]  DIAZEPAM 5 MG TABS (DIAZEPAM) Take 1 tablet by mouth two times a day as needed anxiety  #60 x 0   Entered and Authorized by:   Nelda Bucks DO   Signed by:   Nelda Bucks DO on 06/18/2010   Method used:   Print then Give to Patient   RxID:   [id number]    Orders Added: 1)  Est. Patient Level III [14782]     Prevention & Chronic Care Immunizations   Influenza vaccine: State Fluvax 3  (05/05/2010)    Tetanus booster: Not documented    Pneumococcal vaccine: Not documented  Other Screening   Smoking status: current  (06/18/2010)   Smoking cessation counseling: yes  (06/18/2010)

## 2010-07-11 ENCOUNTER — Other Ambulatory Visit: Payer: Self-pay | Admitting: *Deleted

## 2010-07-11 ENCOUNTER — Emergency Department (HOSPITAL_COMMUNITY)
Admission: EM | Admit: 2010-07-11 | Discharge: 2010-07-12 | Disposition: A | Discharge status: Home / Self Care | Payer: Medicaid Other | Attending: Emergency Medicine | Admitting: Emergency Medicine

## 2010-07-11 DIAGNOSIS — M549 Dorsalgia, unspecified: Secondary | ICD-10-CM | POA: Insufficient documentation

## 2010-07-11 DIAGNOSIS — X58XXXA Exposure to other specified factors, initial encounter: Secondary | ICD-10-CM | POA: Insufficient documentation

## 2010-07-11 DIAGNOSIS — IMO0002 Reserved for concepts with insufficient information to code with codable children: Secondary | ICD-10-CM | POA: Insufficient documentation

## 2010-07-11 DIAGNOSIS — R079 Chest pain, unspecified: Secondary | ICD-10-CM | POA: Insufficient documentation

## 2010-07-11 DIAGNOSIS — R634 Abnormal weight loss: Secondary | ICD-10-CM | POA: Insufficient documentation

## 2010-07-11 DIAGNOSIS — E860 Dehydration: Secondary | ICD-10-CM | POA: Insufficient documentation

## 2010-07-11 MED ORDER — DIAZEPAM 5 MG PO TABS: 5.0000 mg | ORAL_TABLET | Freq: Two times a day (BID) | ORAL | Status: DC | PRN

## 2010-07-11 NOTE — Telephone Encounter (Signed)
Pt said that since the passing of his father recently would like to get the dose increased if  possible.  Is here in the Clinics today.  Mark Gentry is also in the hospital.

## 2010-07-11 NOTE — Telephone Encounter (Signed)
Prescription for Diazepam 5 mg tablets #60 with 3 refills called to [facility] per order of Dr. Phillips Odor.

## 2010-07-12 ENCOUNTER — Emergency Department (HOSPITAL_COMMUNITY): Payer: Medicaid Other

## 2010-07-18 ENCOUNTER — Ambulatory Visit: Payer: Self-pay | Admitting: Ophthalmology

## 2010-07-18 ENCOUNTER — Other Ambulatory Visit: Payer: Self-pay | Admitting: *Deleted

## 2010-07-18 ENCOUNTER — Encounter: Payer: Self-pay | Admitting: Ophthalmology

## 2010-07-18 MED ORDER — HYDROCODONE-ACETAMINOPHEN 10-500 MG PO TABS: 1.0000 | ORAL_TABLET | ORAL | Status: DC | PRN

## 2010-07-18 NOTE — Telephone Encounter (Signed)
Prescription for Hydrocodone-Acetaminophen 10/500 mg #90 called to [facility].

## 2010-07-29 ENCOUNTER — Ambulatory Visit (INDEPENDENT_AMBULATORY_CARE_PROVIDER_SITE_OTHER): Payer: Medicaid Other | Admitting: Internal Medicine

## 2010-07-29 ENCOUNTER — Encounter: Payer: Self-pay | Admitting: Internal Medicine

## 2010-07-29 VITALS — BP 124/79 | HR 102 | Temp 97.3°F | Ht 65.0 in | Wt 176.1 lb

## 2010-07-29 DIAGNOSIS — F4323 Adjustment disorder with mixed anxiety and depressed mood: Secondary | ICD-10-CM

## 2010-07-29 DIAGNOSIS — M25519 Pain in unspecified shoulder: Secondary | ICD-10-CM

## 2010-07-29 MED ORDER — DIAZEPAM 5 MG PO TABS: 5.0000 mg | ORAL_TABLET | Freq: Two times a day (BID) | ORAL | Status: AC | PRN

## 2010-07-29 MED ORDER — DIAZEPAM 5 MG PO TABS: 5.0000 mg | ORAL_TABLET | Freq: Two times a day (BID) | ORAL | Status: DC | PRN
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
## 2010-07-29 NOTE — Patient Instructions (Signed)
Will get urine drug screen test today Will refill your Diazepam today; however, you need to stretch out the medication so that 60 tablets will last 1 month.  We cannot prescribe more than that in a primary care setting given your history and if you need more than that, you will need to be seen by a psychiatrist. Please make sure you go back to see Dr. Brynda Greathouse for your joint injection
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
## 2010-07-29 NOTE — Assessment & Plan Note (Signed)
Stable and he would like to get steroid injection.  Patient cancelled multiple appointments with Dr. Donnel Saxon medicine and I recommended that he needs to follow up with Dr. Brynda Greathouse for the injections.

## 2010-07-30 LAB — DRUGS OF ABUSE SCREEN W/O ALC, ROUTINE URINE
Amphetamine Screen, Ur: NEGATIVE
Barbiturate Quant, Ur: NEGATIVE
Benzodiazepines.: POSITIVE — AB
Methadone: NEGATIVE
Propoxyphene: NEGATIVE

## 2010-08-01 LAB — BENZODIAZEPINE, QUANTITATIVE, URINE
Flurazepam Metabolite: NEGATIVE NG/ML
Nordiazepam GC/MS Conf: NEGATIVE NG/ML
Oxazepam GC/MS Conf: 2427 NG/ML — ABNORMAL HIGH
Temazapam: 774 NG/ML — ABNORMAL HIGH

## 2010-08-01 LAB — OPIATE, QUANTITATIVE, URINE
Codeine Urine: NEGATIVE NG/ML
Oxycodone - Total: NEGATIVE NG/ML

## 2010-08-14 ENCOUNTER — Telehealth: Payer: Self-pay | Admitting: Licensed Clinical Social Worker

## 2010-08-14 NOTE — Telephone Encounter (Signed)
20 minutes.  Soc. Work.  Extensive phone call with Mark Gentry.  He is extremely overwhelmed with the care of his grandfather who has recently been d/c from [facility] for a broken leg.  His father died from terminal cancer on 2022/08/19. He is working with [facility] on a plan of care and social work is involved.  He also has chronic pain from his shoulder and knees and needs to see an orthopedist.   He is also trying to work and provide for his five kids ages 21 to 86.    We discussed psychiatric care and he admits that this should be a priority right now.  I offered to connect him to [facility] and he said that he would prefer to go to his sister's psychiatrist who is familiar with the family story.  He has Medicaid to finance those visits.  He did not know the name of the psychiatrist but he is out of [place].   I've encouraged him to connect with psychiatry and explained that his MD does not feel entirely comfortable managing his mental health care without expert advice from a psychiatrist.  SW will follow to see if [name] can connect to psychiatry on his own.  I've urged him to pick out one thing that he can do for himself to care for himself and he said that the mental health care is likely the priority right now.

## 2010-08-25 ENCOUNTER — Other Ambulatory Visit: Payer: Self-pay | Admitting: *Deleted

## 2010-08-26 LAB — DIFFERENTIAL
Lymphocytes Relative: 24 % (ref 12–46)
Lymphs Abs: 2 10*3/uL (ref 0.7–4.0)
Monocytes Absolute: 0.6 10*3/uL (ref 0.1–1.0)
Monocytes Relative: 7 % (ref 3–12)
Neutro Abs: 5.6 10*3/uL (ref 1.7–7.7)
Neutrophils Relative %: 68 % (ref 43–77)

## 2010-08-26 LAB — URINALYSIS, ROUTINE W REFLEX MICROSCOPIC
Bilirubin Urine: NEGATIVE
Glucose, UA: NEGATIVE mg/dL
Hgb urine dipstick: NEGATIVE
Specific Gravity, Urine: 1.009 (ref 1.005–1.030)
Urobilinogen, UA: 0.2 mg/dL (ref 0.0–1.0)
pH: 6 (ref 5.0–8.0)

## 2010-08-26 LAB — CBC
HCT: 45.7 % (ref 39.0–52.0)
Hemoglobin: 16.3 g/dL (ref 13.0–17.0)
MCHC: 35.7 g/dL (ref 30.0–36.0)
Platelets: 263 10*3/uL (ref 150–400)
RDW: 12.7 % (ref 11.5–15.5)

## 2010-08-26 LAB — COMPREHENSIVE METABOLIC PANEL
Albumin: 4.4 g/dL (ref 3.5–5.2)
Alkaline Phosphatase: 68 U/L (ref 39–117)
BUN: 10 mg/dL (ref 6–23)
Calcium: 9 mg/dL (ref 8.4–10.5)
Glucose, Bld: 95 mg/dL (ref 70–99)
Potassium: 4 mEq/L (ref 3.5–5.1)
Total Protein: 7.8 g/dL (ref 6.0–8.3)

## 2010-08-26 LAB — POCT CARDIAC MARKERS
CKMB, poc: 1.3 ng/mL (ref 1.0–8.0)
CKMB, poc: <1 ng/mL — ABNORMAL LOW (ref 1.0–8.0)
Myoglobin, poc: 86 ng/mL (ref 12–200)
Troponin i, poc: <0.05 ng/mL (ref 0.00–0.09)

## 2010-08-26 LAB — RAPID URINE DRUG SCREEN, HOSP PERFORMED
Amphetamines: NOT DETECTED
Barbiturates: POSITIVE — AB
Cocaine: NOT DETECTED
Opiates: NOT DETECTED
Tetrahydrocannabinol: POSITIVE — AB

## 2010-08-27 MED ORDER — FLUTICASONE PROPIONATE 50 MCG/ACT NA SUSP: 2.0000 | Freq: Every day | NASAL | Status: AC

## 2010-08-27 NOTE — Telephone Encounter (Signed)
Will not refill his narcotics until he sees a psychiatrist and orthopedist/sport medicine.

## 2010-08-28 ENCOUNTER — Other Ambulatory Visit: Payer: Self-pay | Admitting: *Deleted

## 2010-08-28 NOTE — Telephone Encounter (Signed)
Last refill 2/24 

## 2010-08-28 NOTE — Telephone Encounter (Signed)
Refill faxed to pharmacy with note to have pt call the Clinics for further instructions.

## 2010-09-04 ENCOUNTER — Other Ambulatory Visit: Payer: Self-pay | Admitting: *Deleted

## 2010-09-05 MED ORDER — HYDROCODONE-ACETAMINOPHEN 10-500 MG PO TABS: 1.0000 | ORAL_TABLET | ORAL | Status: DC | PRN

## 2010-09-05 NOTE — Telephone Encounter (Signed)
Please clarify refill needed, thx

## 2010-09-05 NOTE — Telephone Encounter (Signed)
Pt called and given time for appt.Mark KitchenMarland Kitchen5/1 1330, stressed he must come for appt for future refills, he is agreeable

## 2010-09-05 NOTE — Telephone Encounter (Signed)
Dr Anselm Jungling saw in March. No F/U interval rec. No narc contract. Abnl UDS. Will refill this once. Get pt in with Dr Anselm Jungling in [place] to discuss refills, contracts and repeat UDS.

## 2010-09-05 NOTE — Telephone Encounter (Signed)
Have called to pharm, will call pt after speaking w/ scheduling for an appt

## 2010-09-23 ENCOUNTER — Ambulatory Visit: Payer: Medicaid Other | Admitting: Internal Medicine

## 2010-10-07 NOTE — Consult Note (Signed)
NAME:  Mark Gentry, Mark Gentry NO.:  [id number]   MEDICAL RECORD NO.:  [id number]          PATIENT TYPE:  EMS   LOCATION:  [facility]                         FACILITY:  [facility]   PHYSICIAN:  Jefry H. Pollyann Kennedy, MD     DATE OF BIRTH:  [birth year]   DATE OF CONSULTATION:  10/15/2008  DATE OF DISCHARGE:                                 CONSULTATION   REASON FOR CONSULTATION:  Severe sore throat, possible abscess.   HISTORY:  A 30 year old who 2 days ago started developing a severe sore  throat, mainly on the right side.  He has had some ear pain as well.  He  denies any prior history of such problems.  He has had difficulty  opening his mouth.  He has not been treated yet.  CT scan was performed  in the emergency department and I have reviewed this.  There is what  appears to be some inflammatory lymph nodes with possible early  separation on the right side, the largest is about 2 cm.   PAST MEDICAL HISTORY:  Significant for cigarette smoking.  No other  medical history.   PHYSICAL EXAMINATION:  GENERAL:  A healthy-appearing gentleman, in no  distress.  He is having no difficulty handling secretions and no  difficulty with breathing.  NECK:  Some tender adenopathy in the right jugulodigastric chain.  There  is no erythema or fluctuance of the neck.  HEENT:  Nasal exam clear.  Oral cavity and pharynx reveals mild trismus.  The soft palate appears normal and symmetric.  There is no evidence of  fullness.  The tonsils themselves are relatively small and free of any  acute inflammatory changes.  The dentition is in good repair.   IMPRESSION:  Acute pharyngitis with lymphadenitis and possibly very  early separation.   RECOMMENDATIONS:  Treat with antibiotic using clindamycin 300 mg q.i.d.  and to reevaluate in 48 hours in the office, sooner if he gets any  worse.  If he improves and continues to improve, then surgical  intervention will not be necessary.  He is instructed to try to  make  efforts to stop smoking.  Prescription provided for clindamycin and for  Lortab for analgesia.      Jefry H. Pollyann Kennedy, MD  Electronically Signed     JHR/MEDQ  D:  10/15/2008  T:  10/16/2008  Job:  [id number]

## 2010-10-14 ENCOUNTER — Ambulatory Visit (INDEPENDENT_AMBULATORY_CARE_PROVIDER_SITE_OTHER): Payer: Self-pay | Admitting: Internal Medicine

## 2010-10-14 ENCOUNTER — Encounter: Payer: Self-pay | Admitting: Internal Medicine

## 2010-10-14 DIAGNOSIS — M79609 Pain in unspecified limb: Secondary | ICD-10-CM

## 2010-10-14 DIAGNOSIS — F4323 Adjustment disorder with mixed anxiety and depressed mood: Secondary | ICD-10-CM

## 2010-10-14 DIAGNOSIS — F419 Anxiety disorder, unspecified: Secondary | ICD-10-CM

## 2010-10-14 DIAGNOSIS — M25519 Pain in unspecified shoulder: Secondary | ICD-10-CM

## 2010-10-14 DIAGNOSIS — F329 Major depressive disorder, single episode, unspecified: Secondary | ICD-10-CM

## 2010-10-14 DIAGNOSIS — T50901A Poisoning by unspecified drugs, medicaments and biological substances, accidental (unintentional), initial encounter: Secondary | ICD-10-CM

## 2010-10-14 DIAGNOSIS — M25569 Pain in unspecified knee: Secondary | ICD-10-CM

## 2010-10-14 DIAGNOSIS — F411 Generalized anxiety disorder: Secondary | ICD-10-CM

## 2010-10-14 MED ORDER — HYDROCODONE-ACETAMINOPHEN 10-500 MG PO TABS: 1.0000 | ORAL_TABLET | ORAL | Status: DC | PRN

## 2010-10-14 MED ORDER — DIAZEPAM 5 MG PO TABS: 5.0000 mg | ORAL_TABLET | Freq: Two times a day (BID) | ORAL | Status: AC | PRN

## 2010-10-14 NOTE — Patient Instructions (Signed)
Will refer to orthopedist- Dr. Jerl Santos at [facility] for left shoulder and knee pain.  You will need to be evaluated by orthopedist because we cannot continue to give you narcotics. You can also try NSAIDs such as ibuprofen for pain control as well. Will also refer to psychiatry for anxiety/depression.  Will not continue to refill Valium unless you are seen by psychiatry. Follow up in 3-4 months

## 2010-10-15 LAB — DRUGS OF ABUSE SCREEN W/O ALC, ROUTINE URINE
Benzodiazepines.: POSITIVE — AB
Creatinine,U: 66.7 mg/dL
Marijuana Metabolite: POSITIVE — AB
Methadone: NEGATIVE
Phencyclidine (PCP): NEGATIVE
Propoxyphene: NEGATIVE
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
## 2010-10-15 NOTE — Assessment & Plan Note (Addendum)
He continues to have shoulder pain which needs to be evaluated by sport medicine or orthopedics.  He has a history of non-compliance when we refer him to specialists; therefore, he keeps delay/defer going to the appropriate specialist to get help.  Again, I explained to him that he cannot and should not be on long-term narcotics because we need to fix the origin of the pain.  I will not give him anymore narcotics until he sees an orthopedist/sport medicine.  I explained to him and he voiced understanding.   Will start tapering him off of Vicodin so will only give #45 and I asked him to alternate with NSAIDs for his pain.   We tried scheduling with Dr. Jerl Santos; however, his office will not able to give him an appointment until his medicaid is approved.  Will send to sport medicine at this time.  His appointment is on 10/28/10 with Dr. Christella Hartigan. -Will get UDS today. -He will need ultrasound/MRI of both his left shoulder and knee.
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
## 2010-10-15 NOTE — Assessment & Plan Note (Signed)
Please see shoulder pain assessment.

## 2010-10-21 ENCOUNTER — Telehealth: Payer: Self-pay | Admitting: Licensed Clinical Social Worker

## 2010-10-27 LAB — CANNABINOIDS CONFIRMATION, URINE: Carboxy Acid THC: 48 NG/ML — ABNORMAL HIGH

## 2010-10-27 LAB — BENZODIAZEPINE, QUANTITATIVE, URINE
Flurazepam Metabolite: NEGATIVE NG/ML
Nordiazepam GC/MS Conf: 198 NG/ML — ABNORMAL HIGH
Oxazepam GC/MS Conf: 1136 NG/ML — ABNORMAL HIGH

## 2010-10-28 ENCOUNTER — Ambulatory Visit: Payer: Self-pay | Admitting: Family Medicine

## 2010-10-30 NOTE — Telephone Encounter (Signed)
Information was sent to [facility] on how to access psychiatric mental health services in both [place] and [place].  I'm unclear as to which county he actually lives in so gave him both numbers .  On a previous occasion he also told me he wanted to go to his sister's psychiatrist.

## 2010-11-11 ENCOUNTER — Encounter: Payer: Self-pay | Admitting: Family Medicine

## 2010-11-11 ENCOUNTER — Ambulatory Visit (INDEPENDENT_AMBULATORY_CARE_PROVIDER_SITE_OTHER): Payer: Self-pay | Admitting: Family Medicine

## 2010-11-11 DIAGNOSIS — M25519 Pain in unspecified shoulder: Secondary | ICD-10-CM

## 2010-11-11 DIAGNOSIS — M25569 Pain in unspecified knee: Secondary | ICD-10-CM

## 2010-11-11 DIAGNOSIS — M25562 Pain in left knee: Secondary | ICD-10-CM

## 2010-11-11 NOTE — Patient Instructions (Signed)
1) You have bursitis of your shoulder & a possible tear in your meniscus (the cartilage) in your knee 2) We did cortisone injections of your knee & shoulder today 3) Start taking Aleve 2-tabs twice a day with food 4) Start shoulder exercises with green band as shown - do these once a day 5) Start knee exercises as shown - Quad sets - 3 sets of 30 - Straight leg raises - 3 sets of 30 - Short arch leg extensions - 3 sets of 30 6) Start riding stationary bike if you have access to one - 30-45 mins 3-4 times/week 7) Follow-up in 4-6 weeks 8) Call with questions/concerns.

## 2010-11-12 DIAGNOSIS — M25562 Pain in left knee: Secondary | ICD-10-CM | POA: Insufficient documentation

## 2010-11-12 NOTE — Progress Notes (Signed)
Subjective:    Patient ID: Mark Buff., male    DOB: 02-19-1981, 30 y.o.   MRN: [id number]  HPI 30yo R-hand dominant male referred to office by [facility] for evaluation of Lt shoulder pain & left knee pain.  Lt shoulder pain has been on-going for about 6-months.  Denies specific injury or trauma, but he was previously taking care of both his  father (who died 06/17/10 from lung cancer) & his grandfather who was recently admitted to a nursing home.  He was required to do frequent lifting of both of them & thinks may have injured shoulder at that time.  Also does physical work doing [employer].  Pain is along the posterior-lateral aspect of the shoulder and worse with lifting and overhead activity.  He has occasional night-time pain if laying on his left side.  Denies any neck pain, denies any numbness/tingling.  Denies any previous issues with his shoulder.  Has been taking Aleve 1-tab bid which helps some, also completed recent rx for Vicodin from PCP.  Lt knee pain has also been on-going for past 86-months.  He denies any injury or trauma.  Pain is mainly along posterior-lateral aspect of the knee.  He has associated swelling & clicking, with intermittent catching.  Denies any locking or instability.  Denies previous issues with left knee, did have arthroscopy on right knee 3-4 years ago for torn meniscus.  Pain in lt knee feels similar to previous meniscal tear.  He was seeing Dr. Priscille Kluver at [facility] previously, but has been unable to go back to him due to loosing his insurance.  Aleve 1-tab daily has been helping some.  Was also taking vicodin as noted above.  PMH: Reviewed in electronic record & significant for Adjustment D/O with anxiety, drug overdose, THC abuse PSH: Rt knee arthroscopy for partial menisectomy 3-4 years ago ALL: NKDA MEDS: Valium, aleve, Lortab SOC: Marijuana use, employed in [employer], smoker  - 1ppd FAM HX: Dad recently deceased from lung CA  Review of  Systems Per HPI, otherwise negative    Objective:   Physical Exam GEN: AOx3, NAD, pleasant SKIN: multiple tattoos, no rashes or lesions RESP: Respirations 15 & unlabored MSK: - C-spine: FROM without pain.  No midline or paraspinal tenderness.  Neg Spurlings b/l. - Shoulders: Lt shoulder- FROM with painful arch.  TTP over [facility] joint & over deltoid, no TTP at bicipital groove.  No atrophy, no scapular winging or dyskinesia.  (+)Hawkins, (+)Neer, (+)Empty Can, neg Obriens, neg Speeds, neg Yergusons.  Good RTC strength - pain with resisted abduction & external rotation.  Normal lift off test.  Right shoulder with FROM without pain, swelling, tenderness, or weakness. - Knees:  Lt knee- FROM with mild pain with terminal flexion.  No effusion or soft tissue swelling.  TTP along lateral joint line, no medial joint line TTP.  (+)lateral pain with McMurray, but no popping.  No ligamentous laxity.  Neg patellar apprehension, neg patellar grind.  Rt knee with FROM without pain, swelling, tenderness, instability - Hips: FROM without pain, neg log roll - Gait: normal gait, no limp, no leg length difference NEURO: sensation intact to light touch, DTR +2/4 bicep, triceps, brachioradialis VASC: pulses +2/4 radial, DP, PT b/l  MSK U/S: - Left shoulder: Normal appearing bicep.  Normal appearing subscap.  Normal appearing supraspinatus, although noted to have increased fluid in bursa.  Normal appearing infraspinatus.  Normal appearing AC Joint.  No dynamic impingement.  Images saved. - Left knee:  Normal appearing quad tendon, no increased fluid in suprapatellar pouch.  Normal appearing patellar tendon.  Normal appearing medial & lateral meniscus.  Images saved.       Assessment & Plan:

## 2010-11-12 NOTE — Assessment & Plan Note (Signed)
Lt shoulder pain secondary to subacromial bursitis - MSK ultrasound showing normal appearing RTC, but increased bursal fluid - Underwent subacromial corticosteroid injection of left shoulder in office today: Consent obtained and verified. Sterile betadine prep. Furthur cleansed with alcohol. Topical analgesic spray: Ethyl chloride. Joint: Left shoulder - subacromial Approached in typical fashion with: posterior approach Completed without difficulty Meds: 4cc Lidocaine 1% + 1cc Kenalog 40mg /cc Needle: 25-g 1.5 inch Aftercare instructions and Red flags advised. Tolerated procedure well without complications - Instructed on HEP and theraband given - Recommend taking Aleve 2-tabs bid with food - Follow-up 4-6 weeks

## 2010-11-12 NOTE — Assessment & Plan Note (Signed)
Physical exam findings suggest possible meniscal tear, although MSK ultrasound was negative.  This does not exclude meniscal injury, as we were only able to visualize periphery of meniscus on our exam today - Discussed tx options & pt elected to undergo intra-articular corticosteroid injection of the left knee in office today: Consent obtained and verified. Sterile betadine prep. Furthur cleansed with alcohol. Topical analgesic spray: Ethyl chloride. Joint: Lt knee Approached in typical fashion with: anterior lateral approach Completed without difficulty Meds: 4cc Lidocaine 1% + 1cc Kenalog 40mg /cc Needle: 25g 1.5 inch Aftercare instructions and Red flags advised. Tolerated procedure well without complications - Instructed on HEP for the knee - Increase Aleve to 2-tabs bid with food - Will defer any narcotic prescriptions at this time to his PCP, especially since he has hx of substance abuse as noted in recent Urine Drug Screen - Follow-up 4-6 weeks.

## 2010-12-31 ENCOUNTER — Emergency Department (HOSPITAL_COMMUNITY)
Admission: EM | Admit: 2010-12-31 | Discharge: 2010-12-31 | Disposition: A | Discharge status: Home / Self Care | Payer: Medicaid Other | Attending: Emergency Medicine | Admitting: Emergency Medicine

## 2010-12-31 DIAGNOSIS — H9209 Otalgia, unspecified ear: Secondary | ICD-10-CM | POA: Insufficient documentation

## 2010-12-31 DIAGNOSIS — H9190 Unspecified hearing loss, unspecified ear: Secondary | ICD-10-CM | POA: Insufficient documentation

## 2010-12-31 DIAGNOSIS — R599 Enlarged lymph nodes, unspecified: Secondary | ICD-10-CM | POA: Insufficient documentation

## 2010-12-31 DIAGNOSIS — J029 Acute pharyngitis, unspecified: Secondary | ICD-10-CM | POA: Insufficient documentation

## 2010-12-31 DIAGNOSIS — R221 Localized swelling, mass and lump, neck: Secondary | ICD-10-CM | POA: Insufficient documentation

## 2010-12-31 DIAGNOSIS — M542 Cervicalgia: Secondary | ICD-10-CM | POA: Insufficient documentation

## 2010-12-31 DIAGNOSIS — R220 Localized swelling, mass and lump, head: Secondary | ICD-10-CM | POA: Insufficient documentation

## 2011-04-23 ENCOUNTER — Emergency Department (INDEPENDENT_AMBULATORY_CARE_PROVIDER_SITE_OTHER)
Admission: EM | Admit: 2011-04-23 | Discharge: 2011-04-23 | Disposition: A | Discharge status: Home / Self Care | Payer: Self-pay | Source: Home / Self Care

## 2011-04-23 ENCOUNTER — Encounter (HOSPITAL_COMMUNITY): Payer: Self-pay | Admitting: Emergency Medicine

## 2011-04-23 DIAGNOSIS — B9789 Other viral agents as the cause of diseases classified elsewhere: Secondary | ICD-10-CM

## 2011-04-23 DIAGNOSIS — B349 Viral infection, unspecified: Secondary | ICD-10-CM

## 2011-04-23 NOTE — ED Notes (Signed)
Onset Sunday of symptoms, general malaise, ears hurting, general aches, cold chills, minimal cough and cough makes head pound worse and has been ongoing headache for 3 days, poor appetite

## 2011-04-23 NOTE — ED Provider Notes (Signed)
History     CSN: [id number] Arrival date & time: 04/23/2011  2:09 PM   None     Chief Complaint  Patient presents with  . URI    (Consider location/radiation/quality/duration/timing/severity/associated sxs/prior treatment) HPI Comments: Onset of cough, sore throat, nasal congestion and body aches 2 days ago. Pt states he also has decreased appetite and nausea. He has been hot and cold, but does not have a thermometer at home to check his fever. His Rt ear was hurting 2 days ago, but not since. No abd pain, or V/D.   Patient is a 30 y.o. male presenting with cough. The history is provided by the patient.  Cough This is a new problem. The current episode started 2 days ago. The problem occurs every few minutes. The problem has not changed since onset.The cough is non-productive. Associated symptoms include chills, sweats, headaches, rhinorrhea, sore throat and myalgias. Pertinent negatives include no chest pain, no ear pain, no shortness of breath and no wheezing. Treatments tried: Aleve. The treatment provided no relief. His past medical history does not include asthma.    Past Medical History  Diagnosis Date  . Lumbar strain   . Drug overdose   . Allergic rhinitis   . Adjustment disorder with mixed anxiety and depressed mood     Past Surgical History  Procedure Date  . Knee surgery     History reviewed. No pertinent family history.  History  Substance Use Topics  . Smoking status: Former Smoker -- 1.0 packs/day    Types: Cigarettes  . Smokeless tobacco: Not on file  . Alcohol Use: No      Review of Systems  Constitutional: Positive for fever, chills and fatigue.  HENT: Positive for congestion, sore throat and rhinorrhea. Negative for ear pain and sinus pressure.   Respiratory: Positive for cough. Negative for shortness of breath and wheezing.   Cardiovascular: Negative for chest pain.  Musculoskeletal: Positive for myalgias.  Neurological: Positive for headaches.      Allergies  Citalopram hydrobromide  Home Medications   Current Outpatient Rx  Name Route Sig Dispense Refill  . DIAZEPAM 5 MG PO TABS Oral Take 1 tablet (5 mg total) by mouth every 12 (twelve) hours as needed for anxiety or sleep. 30 tablet 0  . DICLOFENAC SODIUM 1 % TD GEL Topical Apply topically 2 (two) times daily. Apply as directed to affected area.     [name] HYDROCODONE-ACETAMINOPHEN 10-500 MG PO TABS Oral Take 1 tablet by mouth every 4 (four) hours as needed. 45 tablet 0    BP 115/69  Pulse 106  Temp(Src) 98.7 F (37.1 C) (Oral)  Resp 18  SpO2 100%  Physical Exam  Nursing note and vitals reviewed. Constitutional: He appears well-developed and well-nourished. No distress.  HENT:  Head: Normocephalic and atraumatic.  Right Ear: Tympanic membrane, external ear and ear canal normal.  Left Ear: Tympanic membrane, external ear and ear canal normal.  Nose: Nose normal.  Mouth/Throat: Uvula is midline, oropharynx is clear and moist and mucous membranes are normal. No oropharyngeal exudate, posterior oropharyngeal edema or posterior oropharyngeal erythema.  Neck: Neck supple.  Cardiovascular: Normal rate, regular rhythm and normal heart sounds.   Pulmonary/Chest: Effort normal and breath sounds normal. No respiratory distress.  Lymphadenopathy:    He has no cervical adenopathy.  Neurological: He is alert.  Skin: Skin is warm and dry.  Psychiatric: He has a normal mood and affect.    ED Course  Procedures (including critical care  time)  Labs Reviewed - No data to display No results found.   1. Viral infection       MDM          Melody Comas, PA 04/23/11 1520

## 2011-04-25 NOTE — ED Provider Notes (Signed)
Medical screening examination/treatment/procedure(s) were performed by non-physician practitioner and as supervising physician I was immediately available for consultation/collaboration.  Corrie Mckusick, MD 04/25/11 1336

## 2011-12-01 ENCOUNTER — Encounter (HOSPITAL_COMMUNITY): Payer: Self-pay | Admitting: *Deleted

## 2011-12-01 ENCOUNTER — Emergency Department (HOSPITAL_COMMUNITY)
Admission: EM | Admit: 2011-12-01 | Discharge: 2011-12-01 | Disposition: A | Discharge status: Home / Self Care | Payer: Medicaid Other | Attending: Emergency Medicine | Admitting: Emergency Medicine

## 2011-12-01 DIAGNOSIS — R1110 Vomiting, unspecified: Secondary | ICD-10-CM | POA: Insufficient documentation

## 2011-12-01 DIAGNOSIS — R197 Diarrhea, unspecified: Secondary | ICD-10-CM | POA: Insufficient documentation

## 2011-12-01 NOTE — ED Notes (Signed)
Pt c/o vomiting, diarrhea, abdominal cramps and generalized body aches.  Last vomited this morning.

## 2012-06-24 ENCOUNTER — Encounter (HOSPITAL_BASED_OUTPATIENT_CLINIC_OR_DEPARTMENT_OTHER): Payer: Self-pay | Admitting: *Deleted

## 2012-06-24 ENCOUNTER — Emergency Department (HOSPITAL_BASED_OUTPATIENT_CLINIC_OR_DEPARTMENT_OTHER)
Admission: EM | Admit: 2012-06-24 | Discharge: 2012-06-24 | Disposition: A | Discharge status: Home / Self Care | Payer: Medicaid Other | Attending: Emergency Medicine | Admitting: Emergency Medicine

## 2012-06-24 ENCOUNTER — Emergency Department (HOSPITAL_BASED_OUTPATIENT_CLINIC_OR_DEPARTMENT_OTHER): Payer: Medicaid Other

## 2012-06-24 DIAGNOSIS — Z8659 Personal history of other mental and behavioral disorders: Secondary | ICD-10-CM | POA: Insufficient documentation

## 2012-06-24 DIAGNOSIS — K5289 Other specified noninfective gastroenteritis and colitis: Secondary | ICD-10-CM | POA: Insufficient documentation

## 2012-06-24 DIAGNOSIS — Z8739 Personal history of other diseases of the musculoskeletal system and connective tissue: Secondary | ICD-10-CM | POA: Insufficient documentation

## 2012-06-24 DIAGNOSIS — N509 Disorder of male genital organs, unspecified: Secondary | ICD-10-CM | POA: Insufficient documentation

## 2012-06-24 DIAGNOSIS — R109 Unspecified abdominal pain: Secondary | ICD-10-CM | POA: Insufficient documentation

## 2012-06-24 DIAGNOSIS — J309 Allergic rhinitis, unspecified: Secondary | ICD-10-CM | POA: Insufficient documentation

## 2012-06-24 DIAGNOSIS — F17200 Nicotine dependence, unspecified, uncomplicated: Secondary | ICD-10-CM | POA: Insufficient documentation

## 2012-06-24 DIAGNOSIS — R112 Nausea with vomiting, unspecified: Secondary | ICD-10-CM | POA: Insufficient documentation

## 2012-06-24 DIAGNOSIS — N50812 Left testicular pain: Secondary | ICD-10-CM

## 2012-06-24 DIAGNOSIS — K529 Noninfective gastroenteritis and colitis, unspecified: Secondary | ICD-10-CM

## 2012-06-24 LAB — COMPREHENSIVE METABOLIC PANEL
Alkaline Phosphatase: 95 U/L (ref 39–117)
BUN: 10 mg/dL (ref 6–23)
Chloride: 102 mEq/L (ref 96–112)
GFR calc Af Amer: >90 mL/min (ref >90)
Glucose, Bld: 100 mg/dL — ABNORMAL HIGH (ref 70–99)
Potassium: 3.9 mEq/L (ref 3.5–5.1)
Total Bilirubin: 0.3 mg/dL (ref 0.3–1.2)

## 2012-06-24 LAB — CBC WITH DIFFERENTIAL/PLATELET
Eosinophils Absolute: 0.5 10*3/uL (ref 0.0–0.7)
HCT: 51.1 % (ref 39.0–52.0)
Hemoglobin: 18.8 g/dL — ABNORMAL HIGH (ref 13.0–17.0)
Lymphs Abs: 1.5 10*3/uL (ref 0.7–4.0)
MCH: 32.3 pg (ref 26.0–34.0)
Monocytes Relative: 15 % — ABNORMAL HIGH (ref 3–12)
Neutro Abs: 6.7 10*3/uL (ref 1.7–7.7)
Neutrophils Relative %: 66 % (ref 43–77)
RBC: 5.82 MIL/uL — ABNORMAL HIGH (ref 4.22–5.81)

## 2012-06-24 LAB — LIPASE, BLOOD: Lipase: 12 U/L (ref 11–59)

## 2012-06-24 LAB — URINALYSIS, ROUTINE W REFLEX MICROSCOPIC
Glucose, UA: NEGATIVE mg/dL
Hgb urine dipstick: NEGATIVE
Nitrite: NEGATIVE
Specific Gravity, Urine: 1.029 (ref 1.005–1.030)
pH: 6 (ref 5.0–8.0)

## 2012-06-24 MED ORDER — IBUPROFEN 400 MG PO TABS
600.0000 mg | ORAL_TABLET | Freq: Once | ORAL | Status: AC
  Administered 2012-06-24: 600 mg via ORAL
  Filled 2012-06-24: qty 1

## 2012-06-24 MED ORDER — TRAMADOL HCL 50 MG PO TABS
50.0000 mg | ORAL_TABLET | Freq: Once | ORAL | Status: AC
  Administered 2012-06-24: 50 mg via ORAL
  Filled 2012-06-24: qty 1

## 2012-06-24 MED ORDER — ONDANSETRON 4 MG PO TBDP: 4.0000 mg | ORAL_TABLET | Freq: Three times a day (TID) | ORAL | Status: DC | PRN

## 2012-06-24 MED ORDER — TRAMADOL HCL 50 MG PO TABS: 50.0000 mg | ORAL_TABLET | Freq: Four times a day (QID) | ORAL | Status: DC | PRN

## 2012-06-24 MED ORDER — ONDANSETRON HCL 4 MG/2ML IJ SOLN
4.0000 mg | Freq: Once | INTRAMUSCULAR | Status: AC
  Administered 2012-06-24: 4 mg via INTRAVENOUS
  Filled 2012-06-24: qty 2

## 2012-06-24 MED ORDER — IBUPROFEN 600 MG PO TABS: 600.0000 mg | ORAL_TABLET | Freq: Four times a day (QID) | ORAL | Status: DC | PRN

## 2012-06-24 MED ORDER — SODIUM CHLORIDE 0.9 % IV BOLUS (SEPSIS)
1000.0000 mL | Freq: Once | INTRAVENOUS | Status: AC
  Administered 2012-06-24: 1000 mL via INTRAVENOUS

## 2012-06-24 NOTE — ED Notes (Signed)
Urinal provided.

## 2012-06-24 NOTE — ED Provider Notes (Signed)
History     CSN: [id number]  Arrival date & time 06/24/12  4098   First MD Initiated Contact with Patient 06/24/12 [phone]      Chief Complaint  Patient presents with  . Emesis  . Diarrhea    (Consider location/radiation/quality/duration/timing/severity/associated sxs/prior treatment) HPI Pt with 3 day history of diarrhea 10 episodes per day and then devoloped vomiting over the last 2 days. States he has been able to keep fluids and solids down but has immediate diarrhea. No fever or chills. +episodic abdominal cramping. No blood in stool or vomit. No sick contacts. Pt is asking for something to drink.  Pt alson c/o of left testicular pain x 3 days. Pain is constant. Worse with palpation. No penile d/c. No trauma.  Past Medical History  Diagnosis Date  . Lumbar strain   . Drug overdose   . Allergic rhinitis   . Adjustment disorder with mixed anxiety and depressed mood     Past Surgical History  Procedure Date  . Knee surgery     No family history on file.  History  Substance Use Topics  . Smoking status: Current Every Day Smoker -- 0.5 packs/day    Types: Cigarettes  . Smokeless tobacco: Not on file  . Alcohol Use: Yes      Review of Systems  Constitutional: Negative for fever and chills.  HENT: Negative for sore throat.   Gastrointestinal: Positive for nausea, vomiting, abdominal pain and diarrhea. Negative for blood in stool.  Genitourinary: Positive for testicular pain. Negative for dysuria, penile swelling, scrotal swelling and penile pain.  Musculoskeletal: Negative for back pain.  Skin: Negative for rash.  Neurological: Negative for weakness, light-headedness, numbness and headaches.  All other systems reviewed and are negative.    Allergies  Citalopram hydrobromide  Home Medications   Current Outpatient Rx  Name  Route  Sig  Dispense  Refill  . IBUPROFEN 600 MG PO TABS   Oral   Take 1 tablet (600 mg total) by mouth every 6 (six) hours as needed  for pain.   30 tablet   0   . ONDANSETRON 4 MG PO TBDP   Oral   Take 1 tablet (4 mg total) by mouth every 8 (eight) hours as needed for nausea.   20 tablet   0   . TRAMADOL HCL 50 MG PO TABS   Oral   Take 1 tablet (50 mg total) by mouth every 6 (six) hours as needed for pain.   15 tablet   0     BP 146/91  Pulse 96  Temp 98.9 F (37.2 C) (Oral)  Resp 20  Ht 5\' 6"  (1.676 m)  Wt 155 lb (70.308 kg)  BMI 25.02 kg/m2  Physical Exam  Nursing note and vitals reviewed. Constitutional: He is oriented to person, place, and time. He appears well-developed and well-nourished. No distress.  HENT:  Head: Normocephalic and atraumatic.  Mouth/Throat: Oropharynx is clear and moist.  Eyes: EOM are normal. Pupils are equal, round, and reactive to light.  Neck: Normal range of motion. Neck supple.  Cardiovascular: Normal rate and regular rhythm.   Pulmonary/Chest: Effort normal and breath sounds normal. No respiratory distress. He has no wheezes. He has no rales.  Abdominal: Soft. Bowel sounds are normal. He exhibits no distension and no mass. There is tenderness (mild LLQ/LUQ tenderness without rebound or guarding.). There is no rebound and no guarding.  Genitourinary: Penis normal.       No inguinal hernia.  Cremasteric reflex present bl.  No testicular swelling or masses.  Normal lie of both testicles.  L testicle tenderness No penile d/c  Musculoskeletal: Normal range of motion. He exhibits no edema and no tenderness.  Neurological: He is alert and oriented to person, place, and time.  Skin: Skin is warm and dry. No rash noted. No erythema.  Psychiatric: He has a normal mood and affect. His behavior is normal.    ED Course  Procedures (including critical care time)  Labs Reviewed  URINALYSIS, ROUTINE W REFLEX MICROSCOPIC - Abnormal; Notable for the following:    Color, Urine AMBER (*)  BIOCHEMICALS MAY BE AFFECTED BY COLOR   Bilirubin Urine SMALL (*)     Ketones, ur 15 (*)      All other components within normal limits  CBC WITH DIFFERENTIAL - Abnormal; Notable for the following:    RBC 5.82 (*)     Hemoglobin 18.8 (*)     MCHC 36.8 (*)     Monocytes Relative 15 (*)     Monocytes Absolute 1.5 (*)     All other components within normal limits  COMPREHENSIVE METABOLIC PANEL - Abnormal; Notable for the following:    Glucose, Bld 100 (*)     All other components within normal limits  LIPASE, BLOOD   US Scrotum  06/24/2012  *RADIOLOGY REPORT*  Clinical Data:  Left testicular pain for 3 days.  SCROTAL ULTRASOUND DOPPLER ULTRASOUND OF THE TESTICLES  Technique: Complete ultrasound examination of the testicles, epididymis, and other scrotal structures was performed.  Color and spectral Doppler ultrasound were also utilized to evaluate blood flow to the testicles.  Comparison:  CT of the abdomen and pelvis with contrast 02/08/2006 at [facility]  Findings:  Right testis:  The right testis is of normal size and echotexture measuring 4.4 x 2.5 x 3.0 cm.  Color Doppler flow is present and symmetric to the left.  Left testis:  The right testis is of normal size and echotexture measuring 4.4 x 2.5 x 3.0 cm.  Color Doppler flow is present and symmetric to the right.  Right epididymis:  Normal in size and appearance.  Left epididymis:  Normal in size and appearance.  Hydrocele:  Absent  Varicocele:  Absent  Pulsed Doppler interrogation of both testes demonstrates low resistance flow bilaterally.  IMPRESSION:  1.  Normal bilateral scrotal ultrasound and Doppler waveform analysis.   Original Report Authenticated By: Marin Roberts, M.D.    [place] Art/ven Flow Abd Pelv Doppler  06/24/2012  *RADIOLOGY REPORT*  Clinical Data:  Left testicular pain for 3 days.  SCROTAL ULTRASOUND DOPPLER ULTRASOUND OF THE TESTICLES  Technique: Complete ultrasound examination of the testicles, epididymis, and other scrotal structures was performed.  Color and spectral Doppler ultrasound were also  utilized to evaluate blood flow to the testicles.  Comparison:  CT of the abdomen and pelvis with contrast 02/08/2006 at [facility].  Findings:  Right testis:  The right testis is of normal size and echotexture measuring 4.4 x 2.5 x 3.0 cm.  Color Doppler flow is present and symmetric to the left.  Left testis:  The right testis is of normal size and echotexture measuring 4.4 x 2.5 x 3.0 cm.  Color Doppler flow is present and symmetric to the right.  Right epididymis:  Normal in size and appearance.  Left epididymis:  Normal in size and appearance.  Hydrocele:  Absent  Varicocele:  Absent  Pulsed Doppler interrogation of both testes demonstrates low resistance  flow bilaterally.  IMPRESSION:  1.  Normal bilateral scrotal ultrasound and Doppler waveform analysis.   Original Report Authenticated By: Marin Roberts, M.D.      1. Gastroenteritis   2. Testicular pain, left       MDM   Pt tolerating PO's. No vomiting in ED. Persistent scrotal pain. Normal [place]. Have given urology referral for persistent pain. No urologic emergency present.        Loren Racer, MD 06/24/12 [phone]

## 2012-06-24 NOTE — ED Notes (Signed)
Patient states he has a 3 days history of nausea, vomiting and diarrhea.  C/O generalized abdominal cramps and scrotum pain.

## 2012-06-24 NOTE — ED Notes (Signed)
Pt reports left testicular pain 8/10.

## 2012-06-26 ENCOUNTER — Encounter (HOSPITAL_COMMUNITY): Payer: Self-pay | Admitting: *Deleted

## 2012-06-26 DIAGNOSIS — F17200 Nicotine dependence, unspecified, uncomplicated: Secondary | ICD-10-CM | POA: Insufficient documentation

## 2012-06-26 DIAGNOSIS — Z8659 Personal history of other mental and behavioral disorders: Secondary | ICD-10-CM | POA: Insufficient documentation

## 2012-06-26 DIAGNOSIS — R197 Diarrhea, unspecified: Secondary | ICD-10-CM | POA: Insufficient documentation

## 2012-06-26 DIAGNOSIS — Z87828 Personal history of other (healed) physical injury and trauma: Secondary | ICD-10-CM | POA: Insufficient documentation

## 2012-06-26 DIAGNOSIS — K921 Melena: Secondary | ICD-10-CM | POA: Insufficient documentation

## 2012-06-26 DIAGNOSIS — R112 Nausea with vomiting, unspecified: Secondary | ICD-10-CM | POA: Insufficient documentation

## 2012-06-26 NOTE — ED Notes (Signed)
Pt c/o abdominal pain since last Friday.  States he was seen at [facility] for it. C/o n/v/d, abdominal cramping.  States was given medication for nausea, but has had no relief.

## 2012-06-27 ENCOUNTER — Emergency Department (HOSPITAL_COMMUNITY)
Admission: EM | Admit: 2012-06-27 | Discharge: 2012-06-27 | Disposition: A | Discharge status: Home / Self Care | Payer: Medicaid Other | Attending: Emergency Medicine | Admitting: Emergency Medicine

## 2012-06-27 DIAGNOSIS — R197 Diarrhea, unspecified: Secondary | ICD-10-CM

## 2012-06-27 LAB — CBC WITH DIFFERENTIAL/PLATELET
Eosinophils Absolute: 0.6 10*3/uL (ref 0.0–0.7)
Lymphocytes Relative: 30 % (ref 12–46)
MCHC: 37.3 g/dL — ABNORMAL HIGH (ref 30.0–36.0)
Monocytes Absolute: 1.7 10*3/uL — ABNORMAL HIGH (ref 0.1–1.0)
Neutrophils Relative %: 20 % — ABNORMAL LOW (ref 43–77)
RDW: 12.7 % (ref 11.5–15.5)

## 2012-06-27 LAB — COMPREHENSIVE METABOLIC PANEL
ALT: 34 U/L (ref 0–53)
AST: 16 U/L (ref 0–37)
Calcium: 9.8 mg/dL (ref 8.4–10.5)
GFR calc Af Amer: 76 mL/min — ABNORMAL LOW (ref >90)
Glucose, Bld: 98 mg/dL (ref 70–99)
Sodium: 137 mEq/L (ref 135–145)
Total Protein: 7.8 g/dL (ref 6.0–8.3)

## 2012-06-27 LAB — URINALYSIS, MICROSCOPIC ONLY
Hgb urine dipstick: NEGATIVE
Leukocytes, UA: NEGATIVE
Specific Gravity, Urine: 1.039 — ABNORMAL HIGH (ref 1.005–1.030)
Urobilinogen, UA: 0.2 mg/dL (ref 0.0–1.0)

## 2012-06-27 LAB — OCCULT BLOOD, POC DEVICE: Fecal Occult Bld: POSITIVE — AB

## 2012-06-27 LAB — CLOSTRIDIUM DIFFICILE BY PCR: Toxigenic C. Difficile by PCR: NEGATIVE

## 2012-06-27 MED ORDER — METRONIDAZOLE 500 MG PO TABS: 500.0000 mg | ORAL_TABLET | Freq: Two times a day (BID) | ORAL | Status: DC

## 2012-06-27 MED ORDER — SODIUM CHLORIDE 0.9 % IV BOLUS (SEPSIS)
1000.0000 mL | Freq: Once | INTRAVENOUS | Status: AC
  Administered 2012-06-27: 1000 mL via INTRAVENOUS

## 2012-06-27 MED ORDER — METRONIDAZOLE 500 MG PO TABS
500.0000 mg | ORAL_TABLET | Freq: Once | ORAL | Status: AC
  Administered 2012-06-27: 500 mg via ORAL
  Filled 2012-06-27: qty 1

## 2012-06-27 MED ORDER — METOCLOPRAMIDE HCL 5 MG/ML IJ SOLN
10.0000 mg | Freq: Once | INTRAMUSCULAR | Status: AC
  Administered 2012-06-27: 10 mg via INTRAVENOUS
  Filled 2012-06-27: qty 2

## 2012-06-27 MED ORDER — MORPHINE SULFATE 4 MG/ML IJ SOLN
4.0000 mg | Freq: Once | INTRAMUSCULAR | Status: AC
  Administered 2012-06-27: 4 mg via INTRAVENOUS
  Filled 2012-06-27: qty 1

## 2012-06-27 MED ORDER — HYDROCODONE-ACETAMINOPHEN 5-325 MG PO TABS: 1.0000 | ORAL_TABLET | ORAL | Status: DC | PRN

## 2012-06-27 MED ORDER — LOPERAMIDE HCL 2 MG PO CAPS
2.0000 mg | ORAL_CAPSULE | Freq: Once | ORAL | Status: AC
  Administered 2012-06-27: 2 mg via ORAL
  Filled 2012-06-27: qty 2

## 2012-06-27 MED ORDER — DIPHENHYDRAMINE HCL 50 MG/ML IJ SOLN
25.0000 mg | Freq: Once | INTRAMUSCULAR | Status: AC
  Administered 2012-06-27: 25 mg via INTRAVENOUS
  Filled 2012-06-27: qty 1

## 2012-06-27 MED ORDER — DICYCLOMINE HCL 10 MG PO CAPS
10.0000 mg | ORAL_CAPSULE | Freq: Once | ORAL | Status: AC
  Administered 2012-06-27: 10 mg via ORAL
  Filled 2012-06-27: qty 1

## 2012-06-27 MED ORDER — LOPERAMIDE HCL 2 MG PO CAPS: 2.0000 mg | ORAL_CAPSULE | Freq: Four times a day (QID) | ORAL | Status: DC | PRN

## 2012-06-27 NOTE — ED Provider Notes (Signed)
History     CSN: [id number]  Arrival date & time 06/26/12  2309   First MD Initiated Contact with Patient 06/27/12 0041      Chief Complaint  Patient presents with  . Abdominal Pain   HPI  History provided by the patient and recent medical chart. Patient is a 32 year old male with no significant PMH who presents with complaints of persistent watery diarrhea. Patient reports having symptoms of nausea, vomiting and diarrhea for the past one week. He was seen 2 days ago the emergency department for similar symptoms. He reports up to 15 episodes of watery stools per day. He did try using Imodium today but had little relief of symptoms. Symptoms have also been associated with abdominal cramping and sharp pains. The symptoms are brief and episodic. He denies having any fever, chills or sweats. He has been able to keep down some fluids but has not been able to be any solid foods. He denies any recent travel or known sick contacts.    Past Medical History  Diagnosis Date  . Lumbar strain   . Drug overdose   . Allergic rhinitis   . Adjustment disorder with mixed anxiety and depressed mood     Past Surgical History  Procedure Date  . Knee surgery     History reviewed. No pertinent family history.  History  Substance Use Topics  . Smoking status: Current Every Day Smoker -- 0.5 packs/day    Types: Cigarettes  . Smokeless tobacco: Not on file  . Alcohol Use: Yes      Review of Systems  Constitutional: Negative for fever and chills.  Respiratory: Negative for cough.   Cardiovascular: Negative for chest pain.  Gastrointestinal: Positive for nausea, vomiting, abdominal pain, diarrhea and blood in stool.  Genitourinary: Negative for dysuria, frequency, hematuria and flank pain.  All other systems reviewed and are negative.    Allergies  Citalopram hydrobromide  Home Medications   Current Outpatient Rx  Name  Route  Sig  Dispense  Refill  . IBUPROFEN 600 MG PO TABS   Oral   Take 1 tablet (600 mg total) by mouth every 6 (six) hours as needed for pain.   30 tablet   0   . LOPERAMIDE HCL 2 MG PO TABS   Oral   Take 2 mg by mouth 4 (four) times daily as needed. For diarrhea         . ONDANSETRON 4 MG PO TBDP   Oral   Take 1 tablet (4 mg total) by mouth every 8 (eight) hours as needed for nausea.   20 tablet   0   . TRAMADOL HCL 50 MG PO TABS   Oral   Take 1 tablet (50 mg total) by mouth every 6 (six) hours as needed for pain.   15 tablet   0     BP 133/83  Pulse 103  Temp 97.7 F (36.5 C) (Oral)  Resp 18  SpO2 100%  Physical Exam  Nursing note and vitals reviewed. Constitutional: He is oriented to person, place, and time. He appears well-developed and well-nourished. No distress.  HENT:  Head: Normocephalic.  Eyes: Conjunctivae normal are normal.  Neck: Neck supple.       No meningeal sign  Cardiovascular: Normal rate and regular rhythm.   Pulmonary/Chest: Effort normal and breath sounds normal.  Abdominal: Soft. There is no tenderness. There is no rebound and no guarding.  Genitourinary: Guaiac positive stool.  Small hemorrhoid left lateral side. No active bleeding. No significant tenderness. No gross blood on rectal exam. No tenderness or fissures. No masses.  Musculoskeletal: Normal range of motion.  Neurological: He is alert and oriented to person, place, and time.  Skin: Skin is warm.  Psychiatric: He has a normal mood and affect. His behavior is normal.    ED Course  Procedures   Results for orders placed during the hospital encounter of 06/27/12  CBC WITH DIFFERENTIAL      Component Value Range   WBC 12.3 (*) 4.0 - 10.5 K/uL   RBC 6.22 (*) 4.22 - 5.81 MIL/uL   Hemoglobin 20.5 (*) 13.0 - 17.0 g/dL   HCT 16.1 (*) 09.6 - 04.5 %   MCV 88.4  78.0 - 100.0 fL   MCH 33.0  26.0 - 34.0 pg   MCHC 37.3 (*) 30.0 - 36.0 g/dL   RDW 40.9  81.1 - 91.4 %   Platelets PLATELET CLUMPS NOTED ON SMEAR  150 - 400 K/uL   Neutrophils  Relative 20 (*) 43 - 77 %   Lymphocytes Relative 30  12 - 46 %   Monocytes Relative 14 (*) 3 - 12 %   Eosinophils Relative 5  0 - 5 %   Basophils Relative 0  0 - 1 %   Band Neutrophils 31 (*) 0 - 10 %   Neutro Abs 6.3  1.7 - 7.7 K/uL   Lymphs Abs 3.7  0.7 - 4.0 K/uL   Monocytes Absolute 1.7 (*) 0.1 - 1.0 K/uL   Eosinophils Absolute 0.6  0.0 - 0.7 K/uL   Basophils Absolute 0.0  0.0 - 0.1 K/uL   WBC Morphology ATYPICAL LYMPHOCYTES    COMPREHENSIVE METABOLIC PANEL      Component Value Range   Sodium 137  135 - 145 mEq/L   Potassium 3.5  3.5 - 5.1 mEq/L   Chloride 107  96 - 112 mEq/L   CO2 17 (*) 19 - 32 mEq/L   Glucose, Bld 98  70 - 99 mg/dL   BUN 7  6 - 23 mg/dL   Creatinine, Ser 7.82 (*) 0.50 - 1.35 mg/dL   Calcium 9.8  8.4 - 95.6 mg/dL   Total Protein 7.8  6.0 - 8.3 g/dL   Albumin 4.0  3.5 - 5.2 g/dL   AST 16  0 - 37 U/L   ALT 34  0 - 53 U/L   Alkaline Phosphatase 111  39 - 117 U/L   Total Bilirubin 0.3  0.3 - 1.2 mg/dL   GFR calc non Af Amer 66 (*) >90 mL/min   GFR calc Af Amer 76 (*) >90 mL/min  LIPASE, BLOOD      Component Value Range   Lipase 7 (*) 11 - 59 U/L  URINALYSIS, MICROSCOPIC ONLY      Component Value Range   Color, Urine AMBER (*) YELLOW   APPearance CLOUDY (*) CLEAR   Specific Gravity, Urine 1.039 (*) 1.005 - 1.030   pH 5.5  5.0 - 8.0   Glucose, UA NEGATIVE  NEGATIVE mg/dL   Hgb urine dipstick NEGATIVE  NEGATIVE   Bilirubin Urine SMALL (*) NEGATIVE   Ketones, ur 15 (*) NEGATIVE mg/dL   Protein, ur 30 (*) NEGATIVE mg/dL   Urobilinogen, UA 0.2  0.0 - 1.0 mg/dL   Nitrite NEGATIVE  NEGATIVE   Leukocytes, UA NEGATIVE  NEGATIVE   WBC, UA 0-2  <3 WBC/hpf   RBC / HPF 0-2  <3 RBC/hpf  Bacteria, UA FEW (*) RARE   Squamous Epithelial / LPF FEW (*) RARE   Casts HYALINE CASTS (*) NEGATIVE  STOOL CULTURE      Component Value Range   Specimen Description STOOL     Special Requests NONE     Culture Culture reincubated for better growth     Report Status  PENDING    CLOSTRIDIUM DIFFICILE BY PCR      Component Value Range   C difficile by pcr NEGATIVE  NEGATIVE  OCCULT BLOOD, POC DEVICE      Component Value Range   Fecal Occult Bld POSITIVE (*) NEGATIVE         1. Diarrhea       MDM  Patient seen and evaluated. Patient sitting in bed appears in no significant discomfort and is well-appearing. He is not appear severely ill or toxic.   Patient having some improvements of comfort after IV fluids and Zofran. Patient continues to have multiple episodes of diarrhea. Stool culture and C. difficile pending. Patient discussed with attending physician and will treat presumptively for possible C. difficile with Flagyl. Patient given prescriptions for Flagyl, ammonium and Norco for pain. Patient instructed to followup with PCP.     Angus Seller, [place] 06/27/12 2018

## 2012-06-27 NOTE — ED Notes (Signed)
Pt discharged.Vital signs stable and GCS 15 

## 2012-06-28 ENCOUNTER — Observation Stay (HOSPITAL_COMMUNITY)
Admission: EM | Admit: 2012-06-28 | Discharge: 2012-06-30 | Disposition: A | Discharge status: Home / Self Care | Payer: Medicaid Other | Attending: Infectious Disease | Admitting: Infectious Disease

## 2012-06-28 ENCOUNTER — Encounter (HOSPITAL_COMMUNITY): Payer: Self-pay

## 2012-06-28 DIAGNOSIS — E860 Dehydration: Secondary | ICD-10-CM | POA: Diagnosis present

## 2012-06-28 DIAGNOSIS — F4323 Adjustment disorder with mixed anxiety and depressed mood: Secondary | ICD-10-CM

## 2012-06-28 DIAGNOSIS — F1910 Other psychoactive substance abuse, uncomplicated: Secondary | ICD-10-CM

## 2012-06-28 DIAGNOSIS — R197 Diarrhea, unspecified: Secondary | ICD-10-CM

## 2012-06-28 DIAGNOSIS — F17200 Nicotine dependence, unspecified, uncomplicated: Secondary | ICD-10-CM | POA: Insufficient documentation

## 2012-06-28 DIAGNOSIS — K529 Noninfective gastroenteritis and colitis, unspecified: Secondary | ICD-10-CM

## 2012-06-28 DIAGNOSIS — E872 Acidosis: Secondary | ICD-10-CM | POA: Diagnosis present

## 2012-06-28 DIAGNOSIS — E876 Hypokalemia: Secondary | ICD-10-CM | POA: Diagnosis present

## 2012-06-28 DIAGNOSIS — R112 Nausea with vomiting, unspecified: Principal | ICD-10-CM

## 2012-06-28 LAB — CBC WITH DIFFERENTIAL/PLATELET
Eosinophils Absolute: 0.8 10*3/uL — ABNORMAL HIGH (ref 0.0–0.7)
Lymphocytes Relative: 15 % (ref 12–46)
MCH: 33 pg (ref 26.0–34.0)
MCHC: 38.2 g/dL — ABNORMAL HIGH (ref 30.0–36.0)
Monocytes Absolute: 1.7 10*3/uL — ABNORMAL HIGH (ref 0.1–1.0)
Neutrophils Relative %: 65 % (ref 43–77)
Platelets: UNDETERMINED 10*3/uL (ref 150–400)
RBC: 6.15 MIL/uL — ABNORMAL HIGH (ref 4.22–5.81)
WBC Morphology: INCREASED

## 2012-06-28 LAB — COMPREHENSIVE METABOLIC PANEL
ALT: 45 U/L (ref 0–53)
AST: 25 U/L (ref 0–37)
Albumin: 4 g/dL (ref 3.5–5.2)
Calcium: 9.5 mg/dL (ref 8.4–10.5)
Potassium: 3.4 mEq/L — ABNORMAL LOW (ref 3.5–5.1)
Sodium: 137 mEq/L (ref 135–145)
Total Protein: 7.7 g/dL (ref 6.0–8.3)

## 2012-06-28 LAB — RAPID URINE DRUG SCREEN, HOSP PERFORMED
Cocaine: NOT DETECTED
Opiates: NOT DETECTED

## 2012-06-28 MED ORDER — ONDANSETRON HCL 4 MG/2ML IJ SOLN: 4.0000 mg | Freq: Four times a day (QID) | INTRAMUSCULAR | Status: DC | PRN

## 2012-06-28 MED ORDER — FENTANYL CITRATE 0.05 MG/ML IJ SOLN
100.0000 ug | Freq: Once | INTRAMUSCULAR | Status: AC
  Administered 2012-06-28: 100 ug via INTRAVENOUS
  Filled 2012-06-28: qty 2

## 2012-06-28 MED ORDER — KETOROLAC TROMETHAMINE 30 MG/ML IJ SOLN
30.0000 mg | Freq: Four times a day (QID) | INTRAMUSCULAR | Status: AC | PRN
  Administered 2012-06-28: 30 mg via INTRAVENOUS
  Filled 2012-06-28: qty 1

## 2012-06-28 MED ORDER — ONDANSETRON HCL 4 MG/2ML IJ SOLN
4.0000 mg | Freq: Once | INTRAMUSCULAR | Status: AC
  Administered 2012-06-28: 4 mg via INTRAVENOUS
  Filled 2012-06-28: qty 2

## 2012-06-28 MED ORDER — MAGNESIUM OXIDE 400 (241.3 MG) MG PO TABS
200.0000 mg | ORAL_TABLET | Freq: Two times a day (BID) | ORAL | Status: AC
  Administered 2012-06-28 – 2012-06-29 (×3): 200 mg via ORAL
  Filled 2012-06-28 (×3): qty 0.5

## 2012-06-28 MED ORDER — ENOXAPARIN SODIUM 40 MG/0.4ML ~~LOC~~ SOLN
40.0000 mg | Freq: Every day | SUBCUTANEOUS | Status: DC
  Administered 2012-06-29 – 2012-06-30 (×2): 40 mg via SUBCUTANEOUS
  Filled 2012-06-28 (×3): qty 0.4

## 2012-06-28 MED ORDER — NICOTINE 21 MG/24HR TD PT24
21.0000 mg | MEDICATED_PATCH | Freq: Every day | TRANSDERMAL | Status: DC
  Administered 2012-06-29 – 2012-06-30 (×3): 21 mg via TRANSDERMAL
  Filled 2012-06-28 (×3): qty 1

## 2012-06-28 MED ORDER — ONDANSETRON HCL 4 MG PO TABS: 4.0000 mg | ORAL_TABLET | Freq: Four times a day (QID) | ORAL | Status: DC | PRN

## 2012-06-28 MED ORDER — SODIUM CHLORIDE 0.9 % IV BOLUS (SEPSIS)
1000.0000 mL | Freq: Once | INTRAVENOUS | Status: AC
  Administered 2012-06-28: 1000 mL via INTRAVENOUS

## 2012-06-28 MED ORDER — POTASSIUM CHLORIDE CRYS ER 20 MEQ PO TBCR
40.0000 meq | EXTENDED_RELEASE_TABLET | Freq: Once | ORAL | Status: AC
  Administered 2012-06-28: 40 meq via ORAL
  Filled 2012-06-28: qty 2

## 2012-06-28 MED ORDER — ACETAMINOPHEN 325 MG PO TABS
650.0000 mg | ORAL_TABLET | Freq: Four times a day (QID) | ORAL | Status: DC | PRN
  Administered 2012-06-29: 650 mg via ORAL
  Filled 2012-06-28: qty 2

## 2012-06-28 MED ORDER — SODIUM CHLORIDE 0.9 % IV SOLN
INTRAVENOUS | Status: DC
  Administered 2012-06-29 – 2012-06-30 (×3): via INTRAVENOUS

## 2012-06-28 NOTE — ED Provider Notes (Signed)
History     CSN: [id number]  Arrival date & time 06/28/12  0806   First MD Initiated Contact with Patient 06/28/12 0818      Chief Complaint  Patient presents with  . Emesis     HPI Pt c/o vomiting for 10 days and diarrhea the same amount of time. Pt states he is taking flagyl. Has been seen here 3 times already.  Past Medical History  Diagnosis Date  . Lumbar strain   . Drug overdose   . Allergic rhinitis   . Adjustment disorder with mixed anxiety and depressed mood     Past Surgical History  Procedure Date  . Knee surgery     History reviewed. No pertinent family history.  History  Substance Use Topics  . Smoking status: Current Every Day Smoker -- 0.5 packs/day    Types: Cigarettes  . Smokeless tobacco: Not on file  . Alcohol Use: Yes      Review of Systems All other systems reviewed and are negative Allergies  Citalopram hydrobromide  Home Medications   Current Outpatient Rx  Name  Route  Sig  Dispense  Refill  . HYDROCODONE-ACETAMINOPHEN 5-325 MG PO TABS   Oral   Take 1-2 tablets by mouth every 4 (four) hours as needed for pain.   20 tablet   0   . IBUPROFEN 600 MG PO TABS   Oral   Take 1 tablet (600 mg total) by mouth every 6 (six) hours as needed for pain.   30 tablet   0   . LOPERAMIDE HCL 2 MG PO CAPS   Oral   Take 1 capsule (2 mg total) by mouth 4 (four) times daily as needed for diarrhea or loose stools.   12 capsule   0   . METRONIDAZOLE 500 MG PO TABS   Oral   Take 1 tablet (500 mg total) by mouth 2 (two) times daily. One po bid x 7 days   14 tablet   0   . ONDANSETRON 4 MG PO TBDP   Oral   Take 1 tablet (4 mg total) by mouth every 8 (eight) hours as needed for nausea.   20 tablet   0   . TRAMADOL HCL 50 MG PO TABS   Oral   Take 1 tablet (50 mg total) by mouth every 6 (six) hours as needed for pain.   15 tablet   0     BP 111/64  Pulse 93  Temp 97.5 F (36.4 C) (Oral)  Resp 18  SpO2 100%  Physical Exam   Nursing note and vitals reviewed. Constitutional: He is oriented to person, place, and time. He appears well-developed and well-nourished. No distress.  HENT:  Head: Normocephalic and atraumatic.  Mouth/Throat: Mucous membranes are dry.  Eyes: Pupils are equal, round, and reactive to light.  Neck: Normal range of motion.  Cardiovascular: Normal rate and intact distal pulses.   Pulmonary/Chest: No respiratory distress.  Abdominal: Normal appearance. He exhibits no distension. There is no tenderness. There is no rebound.  Musculoskeletal: Normal range of motion.  Neurological: He is alert and oriented to person, place, and time. No cranial nerve deficit.  Skin: Skin is warm and dry. No rash noted.  Psychiatric: He has a normal mood and affect. His behavior is normal.    ED Course  Procedures (including critical care time) Meds ordered this encounter  Medications  . sodium chloride 0.9 % bolus 1,000 mL    Sig:   .  ondansetron (ZOFRAN) injection 4 mg    Sig:   . fentaNYL (SUBLIMAZE) injection 100 mcg    Sig:     Labs Reviewed  CBC WITH DIFFERENTIAL - Abnormal; Notable for the following:    WBC 12.9 (*)     RBC 6.15 (*)     Hemoglobin 20.3 (*)     HCT 53.2 (*)     MCHC 38.2 (*)  RULED OUT INTERFERING SUBSTANCES   Monocytes Relative 13 (*)     Eosinophils Relative 6 (*)     Neutro Abs 8.4 (*)     Monocytes Absolute 1.7 (*)     Eosinophils Absolute 0.8 (*)     All other components within normal limits  COMPREHENSIVE METABOLIC PANEL - Abnormal; Notable for the following:    Potassium 3.4 (*)     CO2 14 (*)     Glucose, Bld 108 (*)     Alkaline Phosphatase 128 (*)     All other components within normal limits   No results found.   1. Nausea vomiting and diarrhea       MDM          Nelia Shi, MD 06/28/12 1220

## 2012-06-28 NOTE — ED Notes (Signed)
While attempting to take pt to room; pt asked for water and told to wait until he sees the doctor as he said he was vomiting; pt then went to water fountain and drank water anyway; pt then told not to drink water again while I was getting wheel chair pt began screaming and cursing and jumping up and down acting very aggressive;[employer] and security with patient

## 2012-06-28 NOTE — ED Provider Notes (Signed)
Medical screening examination/treatment/procedure(s) were performed by non-physician practitioner and as supervising physician I was immediately available for consultation/collaboration.    Vida Roller, MD 06/28/12 0530

## 2012-06-28 NOTE — ED Notes (Signed)
Pt c/o vomiting for 10 days and diarrhea the same amount of time. Pt states he is taking flagyl. Has been seen here 3 times already.

## 2012-06-28 NOTE — ED Notes (Signed)
Phlebotomy at the bedside  

## 2012-06-28 NOTE — Progress Notes (Signed)
[facility] Admission Note Date: 06/28/2012  Patient name: Mark Gentry Medical record number: [id number] Date of birth: 1980-05-30 Age: 32 y.o. 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 This is his 4th ED visit with the same complaints. Patient says his symptoms began suddenly 10 days ago. Since then, he has been having diarrhea approximately once every 20 minutes, which has caused him difficulty sleeping through the night. He is also experiencing nausea and vomiting, mainly in the mornings and at night. He describes 1-3 episodes of vomiting per day with 4-5 episodes of dry heaving. Patient says he has had a minimal appetite during his illness. He usually feels nauseated after eating and can't eat more than a few bites. Patient says his symptoms have remained relatively constant during the past 10 days. Patient denies any fevers or chills. He describes a 10-20 pound weight loss during this illness. The ED prescribed odansetron and loperamide. Patient says loperamide helped somewhat with diarrhea, but only temporarily. He did not notice any improvement of nausea with odansetron. Patient has noted some blood on tissue paper in the past few days, but denies gross blood in toilet. Patient denies dysuria and penile discharge.  Patient denies any change in diet, camping, or travel prior to onset of illness. He does not recall any changes in his work environment. No family members (children and girlfriend) have been ill. Patient denies any variation or association of symptoms with milk or seafood. He has no family history of lactose intolerance, IBD, IBS, or GI cancer. Patient denies any change in marijuana use. Patient denies any history of  STDs, although last testing was several years ago.  PMH: PTSD/anxiety, shoulder pain, s/p knee surgery 5-6 years ago.  SH: Smokes 6-8 cigarettes daily, smokes marijuana daily before bed, drinks EtOH 1-2 times per month  Meds: Current Outpatient Rx  Name  Route  Sig  Dispense  Refill  . HYDROCODONE-ACETAMINOPHEN 5-325 MG PO TABS   Oral   Take 1-2 tablets by mouth every 4 (four) hours as needed for pain.   20 tablet   0   . IBUPROFEN 600 MG PO TABS   Oral   Take 1 tablet (600 mg total) by mouth every 6 (six) hours as needed for pain.   30 tablet   0   . LOPERAMIDE HCL 2 MG PO CAPS   Oral   Take 1 capsule (2 mg total) by mouth 4 (four) times daily as needed for diarrhea or loose stools.   12 capsule   0   . METRONIDAZOLE 500 MG PO TABS   Oral   Take 1 tablet (500 mg total) by mouth 2 (two) times daily. One po bid x 7 days   14 tablet   0   . ONDANSETRON 4 MG PO TBDP   Oral   Take 1 tablet (4 mg total) by mouth every 8 (eight) hours as needed for nausea.   20 tablet   0   . TRAMADOL HCL 50 MG PO TABS   Oral   Take 1 tablet (50 mg total) by mouth every 6 (six) hours as needed for pain.   15 tablet   0     Allergies: Allergies as  of 06/28/2012 - Review Complete 06/28/2012  Allergen Reaction Noted  . Citalopram hydrobromide  05/17/2010   Past Medical History  Diagnosis Date  . Lumbar strain   . Drug overdose   . Allergic rhinitis   . Adjustment disorder with mixed anxiety and depressed mood    Past Surgical History  Procedure Date  . Knee surgery    History reviewed. No pertinent family history. History   Social History  . Marital Status: Divorced    Spouse Name: N/A    Number of Children: N/A  . Years of Education: N/A   Occupational History  . Not on file.   Social History Main Topics  . Smoking status: Current Every Day Smoker -- 0.5 packs/day    Types: Cigarettes  . Smokeless tobacco: Not on file  . Alcohol Use: Yes  . Drug Use: No  .  Sexually Active: Not on file   Other Topics Concern  . Not on file   Social History Narrative   Patient has 5 children, sporadic employment as a Surveyor, minerals.  Married.     Review of Systems: Pertinent items are noted in HPI.  Physical Exam: Blood pressure 134/77, pulse 87, temperature 97.5 F (36.4 C), temperature source Oral, resp. rate 18, SpO2 100.00%. Head: Normocephalic, without obvious abnormality, atraumatic Lungs: clear to auscultation bilaterally Heart: regular rate and rhythm Abdomen: soft, non-distended, normoactive bowel sounds. tenderness to deep palpation of LLQ. no rebound tenderness. exam limited by recent administration of fentanyl. Male genitalia: normal, penis: no lesions or discharge. testes: no masses or tenderness. no hernias Rectal: deferred and   Lab results: Basic Metabolic Panel:  Basename 06/28/12 0823 06/26/12 2355  Mark Gentry 137 137  K 3.4* 3.5  CL 110 107  CO2 14* 17*  GLUCOSE 108* 98  BUN 6 7  CREATININE 1.06 1.38*  CALCIUM 9.5 9.8  MG -- --  PHOS -- --   Liver Function Tests:  [facility] 06/28/12 0823 06/26/12 2355  AST 25 16  ALT 45 34  ALKPHOS 128* 111  BILITOT 0.4 0.3  PROT 7.7 7.8  ALBUMIN 4.0 4.0    Basename 06/26/12 2355  LIPASE 7*  AMYLASE --   No results found for this basename: AMMONIA:2 in the last 72 hours CBC:  Basename 06/28/12 0823 06/26/12 2355  WBC 12.9* 12.3*  NEUTROABS 8.4* 6.3  HGB 20.3* 20.5*  HCT 53.2* 55.0*  MCV 86.5 88.4  PLT PLATELET CLUMPS NOTED ON SMEAR, UNABLE TO ESTIMATE PLATELET CLUMPS NOTED ON SMEAR   Cardiac Enzymes: No results found for this basename: CKTOTAL:3,CKMB:3,CKMBINDEX:3,TROPONINI:3 in the last 72 hours BNP: No results found for this basename: PROBNP:3 in the last 72 hours D-Dimer: No results found for this basename: DDIMER:2 in the last 72 hours CBG: No results found for this basename: GLUCAP:6 in the last 72 hours Hemoglobin A1C: No results found for this basename: HGBA1C in the  last 72 hours Fasting Lipid Panel: No results found for this basename: CHOL,HDL,LDLCALC,TRIG,CHOLHDL,LDLDIRECT in the last 72 hours Thyroid Function Tests: No results found for this basename: TSH,T4TOTAL,FREET4,T3FREE,THYROIDAB in the last 72 hours Anemia Panel: No results found for this basename: VITAMINB12,FOLATE,FERRITIN,TIBC,IRON,RETICCTPCT in the last 72 hours Coagulation: No results found for this basename: LABPROT:2,INR:2 in the last 72 hours Urine Drug Screen: Drugs of Abuse     Component Value Date/Time   LABOPIA NEG 10/14/2010 1544   LABOPIA NONE DETECTED 05/05/2009 1100   COCAINSCRNUR NEG 10/14/2010 1544   COCAINSCRNUR NONE DETECTED 05/05/2009 1100   LABBENZ POS*  10/14/2010 1544   LABBENZ POSITIVE* 05/05/2009 1100   AMPHETMU NEG 10/14/2010 1544   AMPHETMU NONE DETECTED 05/05/2009 1100   THCU POSITIVE* 05/05/2009 1100   LABBARB  Value: POSITIVE        DRUG SCREEN FOR MEDICAL PURPOSES ONLY.  IF CONFIRMATION IS NEEDED FOR ANY PURPOSE, NOTIFY LAB WITHIN 5 DAYS.        LOWEST DETECTABLE LIMITS FOR URINE DRUG SCREEN Drug Class       Cutoff (ng/mL) Amphetamine      1000 Barbiturate      200 Benzodiazepine   200 Tricyclics       300 Opiates          300 Cocaine          300 THC              50* 05/05/2009 1100    Alcohol Level: No results found for this basename: ETH:2 in the last 72 hours Urinalysis:  Basename 06/27/12 0113  COLORURINE AMBER*  LABSPEC 1.039*  PHURINE 5.5  GLUCOSEU NEGATIVE  HGBUR NEGATIVE  BILIRUBINUR SMALL*  KETONESUR 15*  PROTEINUR 30*  UROBILINOGEN 0.2  NITRITE NEGATIVE  LEUKOCYTESUR NEGATIVE   Misc. Labs: 06/27/12 FOBT positive 06/27/12 C. diff screen negative 06/27/12 stool cultures pending  Imaging results:  No results found.  Other results: Date: 11/01/2011  EKG Time: 9:02 PM  Rate: 95 bpm  Rhythm: normal sinus rhythm, PAC's noted  Axis: Normal  Intervals:none  ST&T Change: none  Narrative Interpretation: T   Assessment & Plan by  Problem: Mr. Cech is a 32 yo man who presents with a 10 day history of diarrhea with nausea and vomiting, refractory to zofran and immodium.  Diarrhea: We will admit to floor for work-up. An infectious cause is the most likely, given the patient's sudden onset of symptoms. However, the constant course and duration of illness with lack of fever make a viral etiology less likely. Stool cultures are pending for a bacterial etiology. Although the patient has not had any recent travel, parasites cannot be excluded. HIV and hepatitis can also cause the patient's symptoms. Although the patient is young and relatively healthy, ischemic bowel is a possibility. Metabolic etiologies include hyperthyroidism, especially given the patient's rapid weight loss. Less likely etiologies that can nevertheless not be excluded at this time include autoimmune (celiacs), inflammatory (crohns and UC), and neoplastic. We do not feel the patient is suffering from an acute abdomen given the relatively benign abdominal exam. GU causes of his GI symptoms are likewise unlikely given lack of GU symptoms and normal GU exam.  -Follow-up stool culture results from 06/27/12.  -Will order HIV screen and hepatitis panel to r/o other infectious etiologies.  -Consider an ova and parasite panel.  -TSH, T4, and free T4 to evaluate for hyperthyroidism  -lactic acid level to r/o ischemic bowel  -consider CT abdomen with contrast if above tests are negative. Dehydration: Patient has elevated WBC, HGB, and HCT, likely due to hemoconcentration from ongoing diarrhea.  -Start normal saline IV at 125 cc/hr.   -Repeat CBC and BMP in the morning. Nausea and vomiting: Patient states that this is a secondary concern for him.  -odansetron 4mg  IV prn  Principal Problem:  *Diarrhea Active Problems:  ADJ DISORDER WITH MIXED ANXIETY & DEPRESSED MOOD  Dehydration  Nausea & vomiting   This is a Medical Student Note.  The care of the patient was discussed  with Dr. Dierdre Searles and Mark Gentry and the assessment  and plan was formulated with their assistance.  Please see their note for official documentation of the patient encounter.   SignedRoe Coombs 06/28/2012, 1:00 PM  Internal Medicine Teaching Service Resident Admission Note Date: 06/28/2012  Patient name: Mark Gentry Medical record number: [id number] Date of birth: 1980-12-22 Age: 32 y.o. Gender: male PCP: Mark Meiers, MD  Medical Service:  I have reviewed the note by  MS 4 Roe Coombs and was present during the interview and physical exam.  Please see below for findings, assessment, and plan.  Chief Complaint: diarrhea  History of Present Illness: See above M4 note  Meds: Medications Prior to Admission  Medication Sig Dispense Refill  . HYDROcodone-acetaminophen (NORCO) 5-325 MG per tablet Take 1-2 tablets by mouth every 4 (four) hours as needed for pain.  20 tablet  0  . ibuprofen (ADVIL,MOTRIN) 600 MG tablet Take 1 tablet (600 mg total) by mouth every 6 (six) hours as needed for pain.  30 tablet  0  . loperamide (IMODIUM) 2 MG capsule Take 1 capsule (2 mg total) by mouth 4 (four) times daily as needed for diarrhea or loose stools.  12 capsule  0  . metroNIDAZOLE (FLAGYL) 500 MG tablet Take 1 tablet (500 mg total) by mouth 2 (two) times daily. One po bid x 7 days  14 tablet  0  . ondansetron (ZOFRAN ODT) 4 MG disintegrating tablet Take 1 tablet (4 mg total) by mouth every 8 (eight) hours as needed for nausea.  20 tablet  0  . traMADol (ULTRAM) 50 MG tablet Take 1 tablet (50 mg total) by mouth every 6 (six) hours as needed for pain.  15 tablet  0    Allergies: Allergies as of 06/28/2012 - Review Complete 06/28/2012  Allergen Reaction Noted  . Citalopram hydrobromide  05/17/2010    Past Medical History: Medical Student note reviewed  Family History: Medical Student note reviewed  Social History: Medical Student note reviewed  Surgical History: Medical Student note  reviewed  Review of System: Medical Student note reviewed  Physical Exam: Blood pressure 123/68, pulse 85, temperature 97.8 F (36.6 C), temperature source Oral, resp. rate 18, SpO2 100.00%. General: alert, well-developed, and cooperative to examination.  Head: normocephalic and atraumatic.  Eyes: vision grossly intact, pupils equal, pupils round, pupils reactive to light, no injection and anicteric.  Mouth: pharynx pink and moist, no erythema, and no exudates.  Neck: supple, full ROM, no thyromegaly, no JVD, and no carotid bruits.  Lungs: normal respiratory effort, no accessory muscle use, normal breath sounds, no crackles, and no wheezes. Heart: normal rate, regular rhythm, no murmur, no gallop, and no rub.  Abdomen: soft, mild tenderness to palpation on LLQ, normal bowel sounds, no distention, no guarding, no rebound tenderness, no hepatomegaly, and no splenomegaly.  Msk: no joint swelling, no joint warmth, and no redness over joints.  Pulses: 2+ DP/PT pulses bilaterally Extremities: No cyanosis, clubbing, edema Neurologic: alert & oriented X3, cranial nerves II-XII intact, strength normal in all extremities, sensation intact to light touch, and gait normal.  Skin: turgor normal and no rashes.  Psych: Oriented X3, memory intact for recent and remote, normally interactive, good eye contact, not anxious appearing, and not depressed appearing.    Labs: Reviewed as noted in the Electronic Record  Imaging: Reviewed as noted in the Electronic Record  Assessment & Plan by Problem:  Nausea, vomiting and diarrhea - most likely infectious etiology. Will need to rule out bacterial etiology, HIV, Hepatitis, ischemic bowel  colitis, endocrinology, autoimmune (celiacs), inflammatory (crohns and UC), and neoplastic. - stool culture, HIV, hepatitis panel, TSH, Free T4, Lactic acid,  - Hydration with IVF NS at 125 - symptomatic treatment - consider abd CT if worsening.   VTE:  Lovenox  Dispo: Disposition is deferred at this time, pending lab works.  The patient does have a current PCP Cathlyn Parsons, MD), therefore will be requiring follow-up after discharge.  The patient does not have transportation limitations that hinder transportation to clinic appointments.    Signed: Takeira Yanes 06/28/2012, 7:14 PM

## 2012-06-29 DIAGNOSIS — F1910 Other psychoactive substance abuse, uncomplicated: Secondary | ICD-10-CM

## 2012-06-29 DIAGNOSIS — E872 Acidosis: Secondary | ICD-10-CM | POA: Diagnosis present

## 2012-06-29 DIAGNOSIS — E876 Hypokalemia: Secondary | ICD-10-CM | POA: Diagnosis present

## 2012-06-29 DIAGNOSIS — F4323 Adjustment disorder with mixed anxiety and depressed mood: Secondary | ICD-10-CM

## 2012-06-29 LAB — BASIC METABOLIC PANEL
BUN: 3 mg/dL — ABNORMAL LOW (ref 6–23)
BUN: <3 mg/dL — ABNORMAL LOW (ref 6–23)
CO2: 18 mEq/L — ABNORMAL LOW (ref 19–32)
CO2: 17 mEq/L — ABNORMAL LOW (ref 19–32)
Chloride: 113 mEq/L — ABNORMAL HIGH (ref 96–112)
Creatinine, Ser: 0.99 mg/dL (ref 0.50–1.35)
GFR calc non Af Amer: >90 mL/min (ref >90)
Glucose, Bld: 97 mg/dL (ref 70–99)
Potassium: 3.1 mEq/L — ABNORMAL LOW (ref 3.5–5.1)

## 2012-06-29 LAB — HEPATITIS PANEL, ACUTE
HCV Ab: NEGATIVE
Hep A IgM: NEGATIVE

## 2012-06-29 MED ORDER — SODIUM CHLORIDE 0.9 % IV BOLUS (SEPSIS)
1000.0000 mL | Freq: Once | INTRAVENOUS | Status: AC
  Administered 2012-06-29: 1000 mL via INTRAVENOUS

## 2012-06-29 MED ORDER — CHLORHEXIDINE GLUCONATE 0.12 % MT SOLN
15.0000 mL | Freq: Two times a day (BID) | OROMUCOSAL | Status: DC
  Filled 2012-06-29: qty 15

## 2012-06-29 MED ORDER — MAGNESIUM OXIDE 400 (241.3 MG) MG PO TABS
400.0000 mg | ORAL_TABLET | Freq: Once | ORAL | Status: AC
  Administered 2012-06-29: 400 mg via ORAL
  Filled 2012-06-29: qty 1

## 2012-06-29 MED ORDER — BIOTENE DRY MOUTH MT LIQD: 15.0000 mL | Freq: Two times a day (BID) | OROMUCOSAL | Status: DC

## 2012-06-29 MED ORDER — POTASSIUM CHLORIDE CRYS ER 20 MEQ PO TBCR
40.0000 meq | EXTENDED_RELEASE_TABLET | Freq: Once | ORAL | Status: AC
  Administered 2012-06-29: 40 meq via ORAL
  Filled 2012-06-29: qty 2

## 2012-06-29 MED ORDER — ZOLPIDEM TARTRATE 5 MG PO TABS
5.0000 mg | ORAL_TABLET | Freq: Once | ORAL | Status: AC
  Administered 2012-06-29: 5 mg via ORAL
  Filled 2012-06-29: qty 1

## 2012-06-29 MED ORDER — HYDROCODONE-ACETAMINOPHEN 5-325 MG PO TABS
1.0000 | ORAL_TABLET | ORAL | Status: DC | PRN
  Administered 2012-06-29 – 2012-06-30 (×3): 2 via ORAL
  Filled 2012-06-29 (×3): qty 2

## 2012-06-29 MED ORDER — DIPHENOXYLATE-ATROPINE 2.5-0.025 MG PO TABS
2.0000 | ORAL_TABLET | Freq: Four times a day (QID) | ORAL | Status: DC | PRN
  Administered 2012-06-29 – 2012-06-30 (×4): 2 via ORAL
  Filled 2012-06-29 (×4): qty 2

## 2012-06-29 NOTE — Progress Notes (Signed)
Internal Medicine Teaching Service Attending Note Date: 06/29/2012  Patient name: Mark Gentry  Medical record number: [id number]  Date of birth: Nov 18, 1980    This patient has been seen and discussed with the house staff. Please see their note for complete details. I concur with their findings with the following additions/corrections:  A short fairly dramatic presentation of copious watery bowel movements for 10 days without to 20 bowel movements per day.   His C. Difficile PCR is negative. His stool cultures have been sent. On talking to the patient he has had well on her for years and therefore is at risk for contracting Giardia. He has no family members were sick contacts k. Had no recent travel. He has no history of inflammatory bowel disease in his family. On exam this morning he is diffusely tender to palpation but without rebound.  Will support him symptomatically and I will add a Giardia Cryptosporidium assay to his stool.    Acey Lav 06/29/2012, 2:51 PM

## 2012-06-29 NOTE — Progress Notes (Signed)
Medical Student Daily Progress Note  Subjective: Patient reports ~20 episodes of diarrhea last night, although no nausea or vomiting. Patient has been drinking clear liquids and asks for solid food. He now has abdominal pain in the LLQ that he rates as 6/10 with minimal to no improvement with ketorolac. Reports that he did not sleep well due to diarrhea and abdominal pain. Patient does add that he drinks well water at home, although none of the people living with him have had similar symptoms.   Objective: Vital signs in last 24 hours: Temp:  [97.6 F (36.4 C)-98.3 F (36.8 C)] 97.7 F (36.5 C) (02/05 0642) Pulse Rate:  [79-106] 85  (02/05 0642) Resp:  [18-20] 18  (02/05 0642) BP: (94-134)/(60-81) 122/78 mmHg (02/05 0642) SpO2:  [94 %-100 %] 96 % (02/05 0642) Weight change:  Last BM Date: 06/28/12  Intake/Output from previous day: 02/04 0701 - 02/05 0700 In: 560 [P.O.:560] Out: 100 [Urine:100] Intake/Output this shift:    General appearance: A&Ox3, no acute distress but in some pain Resp: clear to auscultation bilaterally GI: soft, pain in LLQ that does not increase on palpation, normoactive bowel sounds  Lab Results:  [facility] 06/29/12 0525 06/28/12 0823  WBC 10.1 12.9*  HGB 15.1 20.3*  HCT 40.4 53.2*  PLT 302 PLATELET CLUMPS NOTED ON SMEAR, UNABLE TO ESTIMATE   BMET  Basename 06/29/12 0525 06/28/12 0823  Erving Sassano 140 137  K 3.4* 3.4*  CL 113* 110  CO2 18* 14*  GLUCOSE 92 108*  BUN 3* 6  CREATININE 0.99 1.06  CALCIUM 8.5 9.5    Studies/Results: No results found.  Medications:  I have reviewed the patient's current medications. Scheduled:   . antiseptic oral rinse  15 mL Mouth Rinse q12n4p  . chlorhexidine  15 mL Mouth Rinse BID  . enoxaparin (LOVENOX) injection  40 mg Subcutaneous Daily  . magnesium oxide  200 mg Oral BID  . nicotine  21 mg Transdermal Daily  . sodium chloride  1,000 mL Intravenous Once   Continuous:   . sodium chloride      WUJ:[id number], HYDROcodone-acetaminophen, ketorolac, ondansetron (ZOFRAN) IV, ondansetron  Assessment/Plan: Patient is a 32 yo M who is complaining of an extended period of diarrhea and abdominal pain.  1. Diarrhea: Current lab results (stool culture negative to date, HIV screen negative, C. diff PCR negative, lactoferrin pending, hepatitis panel pending, TSH/T4 normal, lactic acid normal) suggest that patient's diarrhea is most likely due to viral gastroenteritis. IBD, GI neoplasm, and diverticulitis cannot be ruled out.  -f/u hepatitis panel results, stool culture, and lactoferrin  -will order lomotil 2 tabs per episode of diarrhea up to 4 times daily for symptom control  2. Abdominal pain: Likely secondary to diarrhea.  -will restart home Vicodin.  3. Dehydration: Patient's CBC numbers are trending down, suggesting resolution of hemoconcentration.  -1L NS bolus over 2 hours  -continue NS infusion at 125 cc/hr  4. Diet/electrolytes: Although patient has appetites, will continue liquid diet for diarrhea control  -clear liquid diet  -continue to replete magnesium and potassium  5. VTE Prophylaxis: Lovenox and activity as tolerated.  6. Disposition: Goal for patient is to decrease diarrhea and abdominal pain. If we can obtain symptomatic control, patient can be discharged later today.  This is a Psychologist, occupational Note.  The care of the patient was discussed with Dr. Dierdre Searles and the assessment and plan formulated with their assistance.  Please see their attached note for official documentation of the daily encounter.  LOS: 1 day   Roe Coombs 06/29/2012, 9:37 AM  Resident Co-sign Daily Note: I have seen the patient and reviewed the daily progress note by MS 4 Roe Coombs and discussed the care of the patient with them.  See below for documentation of my findings, assessment, and plans.  Subjective: Feeling better. Tolerate CL well. Wants to eat " sandwich". Still report > 20  diarrhea last night.  Objective: Vital signs in last 24 hours: Filed Vitals:   06/29/12 0642 06/29/12 1100 06/29/12 1400 06/29/12 2149  BP: 122/78  123/62 133/72  Pulse: 85  61 79  Temp: 97.7 F (36.5 C)  97.5 F (36.4 C) 98.3 F (36.8 C)  TempSrc:   Oral Oral  Resp: 18  18 16   Height:  5\' 6"  (1.676 m)    Weight:  155 lb (70.308 kg)    SpO2: 96%  100% 98%   Physical Exam: General: alert, well-developed, and cooperative to examination.  Lungs: normal respiratory effort, no accessory muscle use, normal breath sounds, no crackles, and no wheezes. Heart: normal rate, regular rhythm, no murmur, no gallop, and no rub.  Abdomen: soft, non-tender, hyperactive bowel sounds, no distention, no guarding, no rebound tenderness, no hepatomegaly, and no splenomegaly.  Msk: no joint swelling, no joint warmth, and no redness over joints.  Pulses: 2+ DP/PT pulses bilaterally Extremities: No cyanosis, clubbing, edema Neurologic: alert & oriented X3, cranial nerves II-XII intact, strength normal in all extremities, sensation intact to light touch, and gait normal.  Skin: turgor normal and no rashes.  Psych: Oriented X3, memory intact for recent and remote, normally interactive, good eye contact, not anxious appearing, and not depressed appearing.   Lab Results: Reviewed and documented in Electronic Record Micro Results: Reviewed and documented in Electronic Record Studies/Results: Reviewed and documented in Electronic Record Medications: I have reviewed the patient's current medications. Scheduled Meds:   . enoxaparin (LOVENOX) injection  40 mg Subcutaneous Daily  . magnesium oxide  200 mg Oral BID  . nicotine  21 mg Transdermal Daily   Continuous Infusions:   . sodium chloride 125 mL/hr at 06/29/12 1432   PRN Meds:.diphenoxylate-atropine, HYDROcodone-acetaminophen, ketorolac, ondansetron (ZOFRAN) IV, ondansetron Assessment/Plan:   1. Diarrhea:  - stool culture, lactoferrin pending -  HIV negative, C. diff PCR negative, TSH/T4 normal, lactic acid normal, hepatitis panel negative - will check Giardia/crytosporidium Ag ( well water) - will check celiac panel ( the clinical manifestation less likely celiac) - Lomotil PRN - advance diet as tol  2. Non Gap metabolic acidosis- associated with GI loss - correcting with NS  3. Dehydration: resolving  -2L NS bolus today  -continue NS infusion at 125 cc/hr  4. Hypomagnesemia and hypokalemia - replaced - follow up on BMP and Mg.  5. Substance abuse: Positive for marijuana - SW consult  6. VTE Prophylaxis: Lovenox   6. Disposition: pending complete resolution of diarrhea. Advance diet and replace fluid and electrolytes.    LOS: 1 day   Addilee Neu 06/29/2012, 9:56 PM

## 2012-06-30 DIAGNOSIS — K529 Noninfective gastroenteritis and colitis, unspecified: Secondary | ICD-10-CM | POA: Diagnosis present

## 2012-06-30 DIAGNOSIS — E876 Hypokalemia: Secondary | ICD-10-CM

## 2012-06-30 DIAGNOSIS — K5289 Other specified noninfective gastroenteritis and colitis: Secondary | ICD-10-CM

## 2012-06-30 LAB — CBC
HCT: 40.4 % (ref 39.0–52.0)
Hemoglobin: 15.1 g/dL (ref 13.0–17.0)
MCH: 32.1 pg (ref 26.0–34.0)
MCHC: 37.4 g/dL — ABNORMAL HIGH (ref 30.0–36.0)
MCHC: 36.9 g/dL — ABNORMAL HIGH (ref 30.0–36.0)
MCV: 86.9 fL (ref 78.0–100.0)
MCV: 87 fL (ref 78.0–100.0)
Platelets: 283 10*3/uL (ref 150–400)
RBC: 4.55 MIL/uL (ref 4.22–5.81)
RDW: 13 % (ref 11.5–15.5)
RDW: 12.9 % (ref 11.5–15.5)
WBC: 10.1 10*3/uL (ref 4.0–10.5)

## 2012-06-30 LAB — COMPREHENSIVE METABOLIC PANEL
ALT: 47 U/L (ref 0–53)
AST: 31 U/L (ref 0–37)
Albumin: 2.7 g/dL — ABNORMAL LOW (ref 3.5–5.2)
CO2: 23 mEq/L (ref 19–32)
Calcium: 8.5 mg/dL (ref 8.4–10.5)
Creatinine, Ser: 0.85 mg/dL (ref 0.50–1.35)
Sodium: 144 mEq/L (ref 135–145)
Total Protein: 5.3 g/dL — ABNORMAL LOW (ref 6.0–8.3)

## 2012-06-30 LAB — GLIADIN ANTIBODIES, SERUM: Gliadin IgA: 6.7 U/mL (ref <20)

## 2012-06-30 LAB — TISSUE TRANSGLUTAMINASE, IGA: Tissue Transglutaminase Ab, IgA: 4.1 U/mL (ref <20)

## 2012-06-30 LAB — GIARDIA/CRYPTOSPORIDIUM SCREEN(EIA)

## 2012-06-30 MED ORDER — DIPHENOXYLATE-ATROPINE 2.5-0.025 MG PO TABS: 2.0000 | ORAL_TABLET | Freq: Four times a day (QID) | ORAL | Status: DC | PRN

## 2012-06-30 NOTE — Progress Notes (Signed)
Medical Student Daily Progress Note  Subjective: Patient reports feeling better over the past 24 hours. He has not had any abdominal pain. He had 2 stools yesterday and one this AM, which he describes as "semi-solid". He feels ready to go home.  Objective: Vital signs in last 24 hours: Temp:  [97.5 F (36.4 C)-98.3 F (36.8 C)] 98 F (36.7 C) (02/06 0500) Pulse Rate:  [61-79] 64  (02/06 0500) Resp:  [16-18] 17  (02/06 0500) BP: (123-133)/(62-72) 127/69 mmHg (02/06 0500) SpO2:  [98 %-100 %] 100 % (02/06 0500) Weight:  [73.392 kg (161 lb 12.8 oz)] 73.392 kg (161 lb 12.8 oz) (02/06 0500) Weight change:  Last BM Date: 06/30/12  Intake/Output from previous day: 02/05 0701 - 02/06 0700 In: 1858 [P.O.:300; I.V.:1558] Out: 2 [Urine:2] Intake/Output this shift: Total I/O In: 240 [P.O.:240] Out: -   General appearance: A&Ox3, no acute distress but in some pain  Resp: clear to auscultation bilaterally  GI: soft, non-tender, non-distended, normoactive bowel sounds  Lab Results:  Basename 06/30/12 0440 06/29/12 0525  WBC 10.7* 10.1  HGB 14.6 15.1  HCT 39.6 40.4  PLT 283 302   BMET  Basename 06/30/12 0440 06/29/12 1513  Jj Enyeart 144 140  K 3.6 3.1*  CL 115* 115*  CO2 23 17*  GLUCOSE 89 97  BUN <3* <3*  CREATININE 0.85 0.76  CALCIUM 8.5 8.0*   Hepatitis panel negative  Studies/Results: No results found.  Medications: Current facility-administered medications:0.9 %  sodium chloride infusion, , Intravenous, Continuous, Sharea Guinther, MD, Last Rate: 125 mL/hr at 06/30/12 0750;  diphenoxylate-atropine (LOMOTIL) 2.5-0.025 MG per tablet 2 tablet, 2 tablet, Oral, QID PRN, Randall Hiss, MD, 2 tablet at 06/30/12 1020;  enoxaparin (LOVENOX) injection 40 mg, 40 mg, Subcutaneous, Daily, Kandace Elrod, MD, 40 mg at 06/30/12 1021 HYDROcodone-acetaminophen (NORCO/VICODIN) 5-325 MG per tablet 1-2 tablet, 1-2 tablet, Oral, Q4H PRN, Dede Query, MD, 2 tablet at 06/30/12 0518;  nicotine (NICODERM CQ - dosed in  mg/24 hours) patch 21 mg, 21 mg, Transdermal, Daily, Darden [place], MD, 21 mg at 06/30/12 1021;  ondansetron (ZOFRAN) injection 4 mg, 4 mg, Intravenous, Q6H PRN, Tawni Melkonian, MD;  ondansetron (ZOFRAN) tablet 4 mg, 4 mg, Oral, Q6H PRN, Dede Query, MD  Assessment/Plan: Patient is a 32 yo M who is complaining of an extended period of diarrhea and abdominal pain.   1. Diarrhea: Current lab results (stool culture negative to date, HIV screen negative, C. diff PCR negative, lactoferrin pending, hepatitis panel negative, TSH/T4 normal, lactic acid normal) suggest that patient's diarrhea is most likely due to viral gastroenteritis. IBD, GI neoplasm, and diverticulitis cannot be ruled out.  -f/u stool culture, giardia/cryptococcus, and lactoferrin  -will order lomotil 2 tabs per episode of diarrhea up to 4 times daily for symptom control   2. Abdominal pain: Has resolved over the past 24 hours. -Patient can continue home Vicodin, which should cover any abdominal discomfort.  3. Dehydration: Seems to have resolved with 2 L NS bolus yesterday. Patient is drinking well PO. -discontinue IV in anticipation of discharge today  4. Diet/electrolytes: Patient is tolerating solid foods well. -full diet as tolerated  5. VTE Prophylaxis: Lovenox and activity as tolerated.   6. Disposition: Patient is ready for discharge today. Will call patient if any pending lab results are positive. Anticipate discharge home in the afternoon.   LOS: 2 days   This is a Psychologist, occupational Note. The care of the patient was discussed with Dr. Dierdre Searles  and Dr. Daiva Eves and the assessment and plan was formulated with their assistance. Please see their note for official documentation of the patient encounter.   Roe Coombs 06/30/2012, 12:38 PM Resident Co-sign Daily Note: I have seen the patient and reviewed the daily progress note by [facility] Roe Coombs and discussed the care of the patient with them.  See below for documentation of my findings,  assessment, and plans.  Subjective: Feeling better. Tolerate diet well. Only two stools. No diarrhea. No acute event overnight. Objective: Vital signs in last 24 hours: Filed Vitals:   06/29/12 1100 06/29/12 1400 06/29/12 2149 06/30/12 0500  BP:  123/62 133/72 127/69  Pulse:  61 79 64  Temp:  97.5 F (36.4 C) 98.3 F (36.8 C) 98 F (36.7 C)  TempSrc:  Oral Oral Oral  Resp:  18 16 17   Height: 5\' 6"  (1.676 m)     Weight: 155 lb (70.308 kg)   161 lb 12.8 oz (73.392 kg)  SpO2:  100% 98% 100%   Physical Exam: General: alert, well-developed, and cooperative to examination.  Head: normocephalic and atraumatic.  Eyes: vision grossly intact, pupils equal, pupils round, pupils reactive to light, no injection and anicteric.  Mouth: pharynx pink and moist, no erythema, and no exudates.  Neck: supple, full ROM, no thyromegaly, no JVD, and no carotid bruits.  Lungs: normal respiratory effort, no accessory muscle use, normal breath sounds, no crackles, and no wheezes. Heart: normal rate, regular rhythm, no murmur, no gallop, and no rub.  Abdomen: soft, non-tender, normal bowel sounds, no distention, no guarding, no rebound tenderness, no hepatomegaly, and no splenomegaly.  Msk: no joint swelling, no joint warmth, and no redness over joints.  Pulses: 2+ DP/PT pulses bilaterally Extremities: No cyanosis, clubbing, edema Neurologic: alert & oriented X3, cranial nerves II-XII intact, strength normal in all extremities, sensation intact to light touch, and gait normal.  Skin: turgor normal and no rashes.  Psych: Oriented X3, memory intact for recent and remote, normally interactive, good eye contact, not anxious appearing, and not depressed appearing.   Lab Results: Reviewed and documented in Electronic Record Micro Results: Reviewed and documented in Electronic Record Studies/Results: Reviewed and documented in Electronic Record Medications: I have reviewed the patient's current  medications. Scheduled Meds:   Continuous Infusions:   PRN Meds:.   Assessment/Plan: Principal Problem:  *Diarrhea Active Problems:  ADJ DISORDER WITH MIXED ANXIETY & DEPRESSED MOOD  Dehydration  Substance abuse  Metabolic acidosis, NAG, bicarbonate losses  Hypokalemia  Hypomagnesemia   1. Acute gastroenteritis, resolved All GI pathology negative so far.  stool culture, giardia/cryptococcus, and lactoferrin pending  - patient is stable and can go home today - lomotol and Zofran PRN  2. Dehydration, resolved 3.   Metabolic acidosis, NAG, bicarbonate losses, resolved 4. Hypokalemia, resolved 5. Hypomagnesemia, resolved 6. Substance abuse, Marijuana , education on cessation    Disposition: D/C today   LOS: 2 days   Osmond Steckman 06/30/2012, 10:54 PM

## 2012-06-30 NOTE — Progress Notes (Signed)
Patient discharged to home with instructions. 

## 2012-06-30 NOTE — Discharge Summary (Signed)
Internal Medicine Teaching [facility] Discharge Note  Name: Mark Gentry MRN: [id number] DOB: Jun 25, 1980 32 y.o.  Date of Admission: 06/28/2012  8:07 AM Date of Discharge: 06/30/2012 Attending Physician: Randall Hiss, MD  Discharge Diagnosis:  1. Acute gastroenteritis  2. Dehydration  3. Metabolic acidosis, NAG, bicarbonate losses  4. Hypokalemia  5. Hypomagnesemia  6. Substance abuse  Discharge Medications:   Medication List     As of 06/30/2012 12:00 PM    STOP taking these medications         loperamide 2 MG capsule   Commonly known as: IMODIUM      metroNIDAZOLE 500 MG tablet   Commonly known as: FLAGYL      TAKE these medications         diphenoxylate-atropine 2.5-0.025 MG per tablet   Commonly known as: LOMOTIL   Take 2 tablets by mouth 4 (four) times daily as needed for diarrhea or loose stools.      HYDROcodone-acetaminophen 5-325 MG per tablet   Commonly known as: NORCO/VICODIN   Take 1-2 tablets by mouth every 4 (four) hours as needed for pain.      ibuprofen 600 MG tablet   Commonly known as: ADVIL,MOTRIN   Take 1 tablet (600 mg total) by mouth every 6 (six) hours as needed for pain.      ondansetron 4 MG disintegrating tablet   Commonly known as: ZOFRAN-ODT   Take 1 tablet (4 mg total) by mouth every 8 (eight) hours as needed for nausea.      traMADol 50 MG tablet   Commonly known as: ULTRAM   Take 1 tablet (50 mg total) by mouth every 6 (six) hours as needed for pain.        Disposition and follow-up:   Mark Gentry was discharged from [facility] in Stable condition.    1. Please follow up on complete resolution of his diarrhea 2. Please check his BMP and Mg  Follow-up Appointments:     Follow-up Information    Follow up with HO,MICHELE, MD. On 07/07/2012. (at 3:00 pm)    Contact information:   [address] [place] [address] [phone]         Discharge Orders    Future Appointments:  Provider: Department: Dept Phone: Center:   07/07/2012 3:00 PM Mathis Dad, MD [facility] [phone] [facility]      Consultations:  None  Procedures Performed:  US Scrotum  06/24/2012  *RADIOLOGY REPORT*  Clinical Data:  Left testicular pain for 3 days.  SCROTAL ULTRASOUND DOPPLER ULTRASOUND OF THE TESTICLES  Technique: Complete ultrasound examination of the testicles, epididymis, and other scrotal structures was performed.  Color and spectral Doppler ultrasound were also utilized to evaluate blood flow to the testicles.  Comparison:  CT of the abdomen and pelvis with contrast 02/08/2006 at [facility].  Findings:  Right testis:  The right testis is of normal size and echotexture measuring 4.4 x 2.5 x 3.0 cm.  Color Doppler flow is present and symmetric to the left.  Left testis:  The right testis is of normal size and echotexture measuring 4.4 x 2.5 x 3.0 cm.  Color Doppler flow is present and symmetric to the right.  Right epididymis:  Normal in size and appearance.  Left epididymis:  Normal in size and appearance.  Hydrocele:  Absent  Varicocele:  Absent  Pulsed Doppler interrogation of both testes demonstrates low resistance flow bilaterally.  IMPRESSION:  1.  Normal bilateral scrotal ultrasound and Doppler waveform analysis.   Original Report Authenticated By: Marin Roberts, M.D.    [place] Art/ven Flow Abd Pelv Doppler  06/24/2012  *RADIOLOGY REPORT*  Clinical Data:  Left testicular pain for 3 days.  SCROTAL ULTRASOUND DOPPLER ULTRASOUND OF THE TESTICLES  Technique: Complete ultrasound examination of the testicles, epididymis, and other scrotal structures was performed.  Color and spectral Doppler ultrasound were also utilized to evaluate blood flow to the testicles.  Comparison:  CT of the abdomen and pelvis with contrast 02/08/2006 at [facility].  Findings:  Right testis:  The right testis is of normal size and echotexture measuring 4.4 x 2.5 x 3.0 cm.  Color  Doppler flow is present and symmetric to the left.  Left testis:  The right testis is of normal size and echotexture measuring 4.4 x 2.5 x 3.0 cm.  Color Doppler flow is present and symmetric to the right.  Right epididymis:  Normal in size and appearance.  Left epididymis:  Normal in size and appearance.  Hydrocele:  Absent  Varicocele:  Absent  Pulsed Doppler interrogation of both testes demonstrates low resistance flow bilaterally.  IMPRESSION:  1.  Normal bilateral scrotal ultrasound and Doppler waveform analysis.   Original Report Authenticated By: Marin Roberts, M.D.    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 This is his 4th ED visit with the same complaints. Patient says his symptoms began suddenly 10 days ago. Since then, he has been having diarrhea approximately once every 20 minutes, which has caused him difficulty sleeping through the night. He is also experiencing nausea and vomiting, mainly in the mornings and at night. He describes 1-3 episodes of vomiting per day with 4-5 episodes of dry heaving. Patient says he has had a minimal appetite during his illness. He usually feels nauseated after eating and can't eat more than a few bites. Patient says his symptoms have remained relatively constant during the past 10 days. Patient denies any fevers or chills. He describes a 10-20 pound weight loss during this illness. The ED prescribed odansetron and loperamide. Patient says loperamide helped somewhat with diarrhea, but only temporarily. He did not notice any improvement of nausea with odansetron. Patient has noted some blood on tissue paper in the past few days, but denies gross blood in toilet. Patient denies dysuria and penile discharge.  Patient denies any change in diet, camping, or travel prior to onset of illness. He does not recall any changes in his work  environment. No family members (children and girlfriend) have been ill. Patient denies any variation or association of symptoms with milk or seafood. He has no family history of lactose intolerance, IBD, IBS, or GI cancer. Patient denies any change in marijuana use. Patient denies any history of STDs, although last testing was several years ago.  PMH: PTSD/anxiety, shoulder pain, s/p knee surgery 5-6 years ago.  SH: Smokes 6-8 cigarettes daily, smokes marijuana daily before bed, drinks EtOH 1-2 times per month  Hospital Course by problem list:  1. Acute gastroenteritis    Patient presented to the hospital on 06/28/12 with approximately 40 episodes of diarrhea per day. We performed an extensive work-up for the etiology of his diarrhea, including clostridium difficile PCR, stool culture, giardia/cryptococcus screen, hepatitis panel, HIV screen, TSH, lactic acid, gliadin Ab, transglutaminase Ab, and reticulin Ab.  And all of the tests were negative. Patient was started on supportive care management including aggressive IV fluids hydration, but advanced diet as tolerated, and correction of electrolyte imbalance.  Oral Lomotil was also started for symptomatic management once his stool pathology panels were negative.  Patient's condition has been much improved upon discharge.  He is stable at the time of discharge.  We will give him short-term Lomotil as needed for diarrhea and Zofran for nausea.  He will followup with his [facility] PCP as outpatient.   2. Dehydration, Metabolic acidosis, NAG, bicarbonate losses,  Hypokalemia, Hypomagnesemia Patient was admitted with elevated WBC, HGB, HCT, and clumped platelets indicative of hemoconcentration. He also had hypokalemia and hypomagnesemia. Aggressive IV fluids hydration was started along with correction of his electrolyte imbalance.  All of this problem resolved upon discharge.  He is stable to go home.   3. Substance abuse, urine is positive for marijuana.  Education  of stopping substance abuse reinforced  Discharge Vitals:  BP 127/69  Pulse 64  Temp 98 F (36.7 C) (Oral)  Resp 17  Ht 5\' 6"  (1.676 m)  Wt 161 lb 12.8 oz (73.392 kg)  BMI 26.12 kg/m2  SpO2 100%  Discharge Labs:  Results for orders placed during the hospital encounter of 06/28/12 (from the past 24 hour(s))  BASIC METABOLIC PANEL     Status: Abnormal   Collection Time   06/29/12  3:13 PM      Component Value Range   Sodium 140  135 - 145 mEq/L   Potassium 3.1 (*) 3.5 - 5.1 mEq/L   Chloride 115 (*) 96 - 112 mEq/L   CO2 17 (*) 19 - 32 mEq/L   Glucose, Bld 97  70 - 99 mg/dL   BUN <3 (*) 6 - 23 mg/dL   Creatinine, Ser 5.78  0.50 - 1.35 mg/dL   Calcium 8.0 (*) 8.4 - 10.5 mg/dL   GFR calc non Af Amer >90  >90 mL/min   GFR calc Af Amer >90  >90 mL/min  MAGNESIUM     Status: Normal   Collection Time   06/29/12 10:15 PM      Component Value Range   Magnesium 1.8  1.5 - 2.5 mg/dL  CBC     Status: Abnormal   Collection Time   06/30/12  4:40 AM      Component Value Range   WBC 10.7 (*) 4.0 - 10.5 K/uL   RBC 4.55  4.22 - 5.81 MIL/uL   Hemoglobin 14.6  13.0 - 17.0 g/dL   HCT 46.9  62.9 - 52.8 %   MCV 87.0  78.0 - 100.0 fL   MCH 32.1  26.0 - 34.0 pg   MCHC 36.9 (*) 30.0 - 36.0 g/dL   RDW 41.3  24.4 - 01.0 %   Platelets 283  150 - 400 K/uL  COMPREHENSIVE METABOLIC PANEL     Status: Abnormal   Collection Time   06/30/12  4:40 AM      Component Value Range   Sodium 144  135 - 145 mEq/L   Potassium 3.6  3.5 - 5.1 mEq/L   Chloride 115 (*) 96 - 112 mEq/L   CO2 23  19 - 32 mEq/L   Glucose, Bld 89  70 - 99 mg/dL   BUN <3 (*) 6 - 23 mg/dL   Creatinine, Ser 2.72  0.50 - 1.35 mg/dL   Calcium 8.5  8.4 - 53.6 mg/dL   Total Protein 5.3 (*) 6.0 - 8.3  g/dL   Albumin 2.7 (*) 3.5 - 5.2 g/dL   AST 31  0 - 37 U/L   ALT 47  0 - 53 U/L   Alkaline Phosphatase 73  39 - 117 U/L   Total Bilirubin 0.4  0.3 - 1.2 mg/dL   GFR calc non Af Amer >90  >90 mL/min   GFR calc Af Amer >90  >90 mL/min     Signed: Jadamarie Butson 06/30/2012, 12:00 PM   Time Spent on Discharge: > 30 minutes Services Ordered on Discharge: None Equipment Ordered on Discharge: None

## 2012-07-01 LAB — RETICULIN ANTIBODIES, IGA W TITER: Reticulin Ab, IgA: NEGATIVE

## 2012-07-01 LAB — STOOL CULTURE

## 2012-07-07 ENCOUNTER — Ambulatory Visit: Payer: Medicaid Other | Admitting: Internal Medicine

## 2012-08-14 ENCOUNTER — Emergency Department (HOSPITAL_BASED_OUTPATIENT_CLINIC_OR_DEPARTMENT_OTHER)
Admission: EM | Admit: 2012-08-14 | Discharge: 2012-08-14 | Disposition: A | Discharge status: Home / Self Care | Payer: Medicaid Other | Attending: Emergency Medicine | Admitting: Emergency Medicine

## 2012-08-14 ENCOUNTER — Encounter (HOSPITAL_BASED_OUTPATIENT_CLINIC_OR_DEPARTMENT_OTHER): Payer: Self-pay

## 2012-08-14 DIAGNOSIS — J029 Acute pharyngitis, unspecified: Secondary | ICD-10-CM | POA: Insufficient documentation

## 2012-08-14 DIAGNOSIS — H9209 Otalgia, unspecified ear: Secondary | ICD-10-CM | POA: Insufficient documentation

## 2012-08-14 DIAGNOSIS — Z8709 Personal history of other diseases of the respiratory system: Secondary | ICD-10-CM | POA: Insufficient documentation

## 2012-08-14 DIAGNOSIS — F17200 Nicotine dependence, unspecified, uncomplicated: Secondary | ICD-10-CM | POA: Insufficient documentation

## 2012-08-14 DIAGNOSIS — Z8659 Personal history of other mental and behavioral disorders: Secondary | ICD-10-CM | POA: Insufficient documentation

## 2012-08-14 DIAGNOSIS — Z87828 Personal history of other (healed) physical injury and trauma: Secondary | ICD-10-CM | POA: Insufficient documentation

## 2012-08-14 LAB — RAPID STREP SCREEN (MED CTR MEBANE ONLY): Streptococcus, Group A Screen (Direct): NEGATIVE

## 2012-08-14 MED ORDER — DEXAMETHASONE 4 MG PO TABS
10.0000 mg | ORAL_TABLET | Freq: Once | ORAL | Status: DC
  Filled 2012-08-14: qty 1

## 2012-08-14 MED ORDER — HYDROCODONE-ACETAMINOPHEN 7.5-325 MG/15ML PO SOLN: 10.0000 mL | Freq: Four times a day (QID) | ORAL | Status: DC | PRN

## 2012-08-14 NOTE — ED Notes (Signed)
Patient complains of sorethroat and right sided earache x 1 day. Denies congestion, cough. No fever

## 2012-08-14 NOTE — ED Provider Notes (Signed)
History    This chart was scribed for Hanley Seamen, MD by Toya Smothers, ED Scribe. The patient was seen in room MH03/MH03. Patient's care was started at 2209.  CSN: [id number]  Arrival date & time 08/14/12  2209   First MD Initiated Contact with Patient 08/14/12 2250      Chief Complaint  Patient presents with  . Sore Throat    HPI  Mark Gentry is a 32 y.o. male who presents to the Emergency Department complaining of 14 hours progressive sore throat and right sided otalgia. Pain is moderate, burning, worse with swallowing, and alleviated by nothing. Pt denies SOB or difficulty breathing. Symptoms have not been treated PTA. No fever, chills, cough, congestion, rhinorrhea, chest pain, SOB, or n/v/d. Pt is an everyday smoker denying alcohol and admitting marijuana use. Medical Hx includes Allergic rhinitis, drug overdose, and lumbar strain.     Past Medical History  Diagnosis Date  . Lumbar strain   . Drug overdose   . Allergic rhinitis   . Adjustment disorder with mixed anxiety and depressed mood     Past Surgical History  Procedure Laterality Date  . Knee surgery      No family history on file.  History  Substance Use Topics  . Smoking status: Current Every Day Smoker -- 0.50 packs/day    Types: Cigarettes  . Smokeless tobacco: Not on file  . Alcohol Use: Yes      Review of Systems  Constitutional: Negative for fever.  HENT: Positive for ear pain and sore throat. Negative for congestion.   Respiratory: Negative for cough.   All other systems reviewed and are negative.    Allergies  Citalopram hydrobromide  Home Medications   Current Outpatient Rx  Name  Route  Sig  Dispense  Refill  . HYDROcodone-acetaminophen (HYCET) 7.5-325 mg/15 ml solution   Oral   Take 10 mLs by mouth 4 (four) times daily as needed for pain.   200 mL   0     BP 120/74  Pulse 77  Temp(Src) 98.9 F (37.2 C) (Oral)  Resp 20  SpO2 98%  Physical Exam  Nursing note and vitals  reviewed. Constitutional: He is oriented to person, place, and time. He appears well-developed and well-nourished. No distress.  HENT:  Head: Normocephalic and atraumatic.  Erythema and mild edema of soft palate and uvula.  Eyes: Conjunctivae and EOM are normal. Pupils are equal, round, and reactive to light.  Neck: Normal range of motion. Neck supple.  R anterior cervical lymph. No posterior cervical lymph adenopathy   Cardiovascular: Normal rate, regular rhythm and normal heart sounds.  Exam reveals no gallop and no friction rub.   No murmur heard. Pulmonary/Chest: Effort normal and breath sounds normal. No respiratory distress. He has no wheezes. He has no rales. He exhibits no tenderness.  Abdominal: Soft. Bowel sounds are normal. He exhibits no distension. There is no tenderness. There is no rebound and no guarding.  Musculoskeletal: Normal range of motion. He exhibits no edema and no tenderness.  Neurological: He is alert and oriented to person, place, and time. No cranial nerve deficit.  Skin: Skin is warm and dry. He is not diaphoretic. No erythema.    ED Course  Procedures DIAGNOSTIC STUDIES: Oxygen Saturation is 98% on room air, normal by my interpretation.    COORDINATION OF CARE: 22:51- Evaluated Pt. Pt is awake, alert, and without distress. 22:56- Patient understand and agree with initial ED impression and plan  with expectations set for ED visit.      1. Viral pharyngitis       MDM   Nursing notes and vitals signs, including pulse oximetry, reviewed.  Summary of this visit's results, reviewed by myself:  Labs:  Results for orders placed during the hospital encounter of 08/14/12 (from the past 24 hour(s))  RAPID STREP SCREEN     Status: None   Collection Time    08/14/12 10:17 PM      Result Value Range   Streptococcus, Group A Screen (Direct) NEGATIVE  NEGATIVE        I personally performed the services described in this documentation, which was  scribed in my presence.  The recorded information has been reviewed and is accurate.   Hanley Seamen, MD 08/14/12 [phone]

## 2013-06-17 ENCOUNTER — Encounter (HOSPITAL_COMMUNITY): Payer: Self-pay | Admitting: Emergency Medicine

## 2013-06-17 ENCOUNTER — Emergency Department (HOSPITAL_COMMUNITY)
Admission: EM | Admit: 2013-06-17 | Discharge: 2013-06-17 | Disposition: A | Discharge status: Home / Self Care | Payer: Medicaid Other | Attending: Emergency Medicine | Admitting: Emergency Medicine

## 2013-06-17 DIAGNOSIS — Z4802 Encounter for removal of sutures: Secondary | ICD-10-CM

## 2013-06-17 DIAGNOSIS — R110 Nausea: Secondary | ICD-10-CM | POA: Insufficient documentation

## 2013-06-17 DIAGNOSIS — Z8709 Personal history of other diseases of the respiratory system: Secondary | ICD-10-CM | POA: Insufficient documentation

## 2013-06-17 DIAGNOSIS — Z8659 Personal history of other mental and behavioral disorders: Secondary | ICD-10-CM | POA: Insufficient documentation

## 2013-06-17 DIAGNOSIS — F17200 Nicotine dependence, unspecified, uncomplicated: Secondary | ICD-10-CM | POA: Insufficient documentation

## 2013-06-17 DIAGNOSIS — IMO0002 Reserved for concepts with insufficient information to code with codable children: Secondary | ICD-10-CM | POA: Insufficient documentation

## 2013-06-17 MED ORDER — CEPHALEXIN 500 MG PO CAPS: 500.0000 mg | ORAL_CAPSULE | Freq: Four times a day (QID) | ORAL | Status: AC

## 2013-06-17 MED ORDER — OXYCODONE-ACETAMINOPHEN 5-325 MG PO TABS: ORAL_TABLET | ORAL | Status: DC

## 2013-06-17 MED ORDER — OXYCODONE-ACETAMINOPHEN 5-325 MG PO TABS: 2.0000 | ORAL_TABLET | Freq: Once | ORAL | Status: DC

## 2013-06-17 MED ORDER — PENICILLIN V POTASSIUM 500 MG PO TABS: 500.0000 mg | ORAL_TABLET | Freq: Four times a day (QID) | ORAL | Status: DC

## 2013-06-17 NOTE — ED Notes (Signed)
Pt states that sutures placed to left wrist last Sunday post self inflicted wound.  No HI/SI.  Pt unsure if sutures need to come out.  Site is red and still oozing some blood

## 2013-06-17 NOTE — ED Provider Notes (Signed)
CSN: [id number]     Arrival date & time 06/17/13  1436 History  This chart was scribed for non-physician practitioner Mark Gentry, working with Mark Argyle, MD by Mark Gentry, ED Scribe. This patient was seen in room TR08C/TR08C and the patient's care was started at 4:12 PM.    Chief Complaint  Patient presents with  . Wound Check    Patient is a 33 y.o. male presenting with wound check. The history is provided by the patient. No language interpreter was used.  Wound Check   HPI Comments: Mark Gentry is a 33 y.o. male who presents to the Emergency Department needing recheck of a wound on his left wrist he incurred when trying to cut himself seven days ago.  States he was never advised when to follow up. Sutures were placed at another facility.  C/o irritation and occasional oozing of red blood. He lists nausea as an associated symptom.  He denies fever and vomiting as associated symptoms.  He states that he is right hand dominant.  He states that he had a TD immunization at his left visit to the ED.  He denies being prescribed any antibiotics.  He denies being allergic to any medications.    Past Medical History  Diagnosis Date  . Lumbar strain   . Drug overdose   . Allergic rhinitis   . Adjustment disorder with mixed anxiety and depressed mood    Past Surgical History  Procedure Laterality Date  . Knee surgery     No family history on file. History  Substance Use Topics  . Smoking status: Current Every Day Smoker -- 0.50 packs/day    Types: Cigarettes  . Smokeless tobacco: Not on file  . Alcohol Use: Yes    Review of Systems  Constitutional: Negative for fever.  Gastrointestinal: Positive for nausea. Negative for vomiting.  Skin: Positive for wound (left wrist).  All other systems reviewed and are negative.    Allergies  Citalopram hydrobromide  Home Medications   Current Outpatient Rx  Name  Route  Sig  Dispense  Refill  . fluticasone (FLONASE) 50  MCG/ACT nasal spray   Each Nare   Place 1 spray into both nostrils daily.         [name] HYDROcodone-acetaminophen (NORCO) 10-325 MG per tablet   Oral   Take 1 tablet by mouth every 6 (six) hours as needed.         . traZODone (DESYREL) 100 MG tablet   Oral   Take 100 mg by mouth at bedtime.         . cephALEXin (KEFLEX) 500 MG capsule   Oral   Take 1 capsule (500 mg total) by mouth 4 (four) times daily.   40 capsule   0   . EXPIRED: fluticasone (FLONASE) 50 MCG/ACT nasal spray   Nasal   2 sprays by Nasal route daily. For allergy symptoms   16 g   3    Triage Vitals: BP 137/82  Pulse 115  Temp(Src) 98.2 F (36.8 C) (Oral)  Resp 20  SpO2 99%  Physical Exam  Nursing note and vitals reviewed. Constitutional: He is oriented to person, place, and time. He appears well-developed and well-nourished. No distress.  HENT:  Head: Normocephalic and atraumatic.  Eyes: EOM are normal.  Neck: Neck supple. No tracheal deviation present.  Cardiovascular: Normal rate.   Pulmonary/Chest: Effort normal. No respiratory distress.  Musculoskeletal: Normal range of motion.  Neurological: He is alert and oriented  to person, place, and time.  Skin: Skin is warm and dry. There is erythema.  Left wrist: volar aspect- superficial horzontal abrasions and newly healing lacerations. 8 sutures in place. Erythematous mild tenderness along lacerations. Newly forming scabs.  Psychiatric: He has a normal mood and affect. His behavior is normal.    ED Course  Procedures (including critical care time)  DIAGNOSTIC STUDIES: Oxygen Saturation is 99% on room air, normal by my interpretation.    COORDINATION OF CARE: 4:13 PM- Discussed removing the patient's sutures on his left wrist and the patient agreed to the treatment plan.   SUTURE REMOVAL Performed by: Mark Gentry'MALLEY, Mark Stansel A.  Consent: Verbal consent obtained. Patient identity confirmed: provided demographic data Time out: Immediately prior to  procedure a "time out" was called to verify the correct patient, procedure, equipment, support staff and site/side marked as required.  Location details: left wrist, volar aspect.  Wound Appearance: newly forming scabs, erythematous, dry.  Sutures/Staples Removed: 8  Facility: sutures placed at another facility Patient tolerance: Patient tolerated the procedure well with no immediate complications.     Labs Review Labs Reviewed - No data to display Imaging Review No results found.  EKG Interpretation   None       MDM   1. Encounter for removal of sutures    Pt presenting for suture removal.  Reports irritation on left wrist where sutures placed 7 days ago at another facility.  Discussed pt with Dr .Romeo AppleHarrison who also examined pt.  Eight sutures removed, will place pt on keflex. [facility]. Return precautions provided. Pt verbalized understanding and agreement with tx plan.   I personally performed the services described in this documentation, which was scribed in my presence. The recorded information has been reviewed and is accurate.    Mark FinnerErin O'Malley, PA-C 06/17/13 1732

## 2013-06-17 NOTE — Discharge Instructions (Signed)
Suture Removal, Care After Refer to this sheet in the next few weeks. These instructions provide you with information on caring for yourself after your procedure. Your health care provider may also give you more specific instructions. Your treatment has been planned according to current medical practices, but problems sometimes occur. Call your health care provider if you have any problems or questions after your procedure. WHAT TO EXPECT AFTER THE PROCEDURE After your stitches (sutures) are removed, it is typical to have the following:  Some discomfort and swelling in the wound area.  Slight redness in the area. HOME CARE INSTRUCTIONS   If you have skin adhesive strips over the wound area, do not take the strips off. They will fall off on their own in a few days. If the strips remain in place after 14 days, you may remove them.  Change any bandages (dressings) at least once a day or as directed by your health care provider. If the bandage sticks, soak it off with warm, soapy water.  Apply cream or ointment only as directed by your health care provider. If using cream or ointment, wash the area with soap and water 2 times a day to remove all the cream or ointment. Rinse off the soap and pat the area dry with a clean towel.  Keep the wound area dry and clean. If the bandage becomes wet or dirty, or if it develops a bad smell, change it as soon as possible.  Continue to protect the wound from injury.  Use sunscreen when out in the sun. New scars become sunburned easily. SEEK MEDICAL CARE IF:  You have increasing redness, swelling, or pain in the wound.  You see pus coming from the wound.  You have a fever.  You notice a bad smell coming from the wound or dressing.  Your wound breaks open (edges not staying together). Document Released: 02/03/2001 Document Revised: 03/01/2013 Document Reviewed: 12/21/2012 [employer] Patient Information 2014 [employer].  

## 2013-06-18 NOTE — ED Provider Notes (Signed)
Medical screening examination/treatment/procedure(s) were conducted as a shared visit with non-physician practitioner(s) and myself.  I personally evaluated the patient during the encounter.  EKG Interpretation   None       I interviewed and examined the patient. Lungs are CTAB. Cardiac exam wnl. Abdomen soft.  Multiple self inflicted wounds w/ a knife to the left wrist on exam. Mostly linear superficial lacerations w/ mild surrounding erythema. Will recommend topical bacitracin and po abx to prevent infection. Pt has been previously seen for these wounds. I offered referral to a hand surgeon and he declined. He is not currently suicidal and states he has good psych f/u.   Mark ArgyleForrest S Saramarie Stinger, MD 06/18/13 1242

## 2014-07-29 IMAGING — US US SCROTUM
1 series · 14 of 25 positions shown · non-contrast
Comparison: CT of the abdomen and pelvis with contrast 02/08/2006
at [facility].

CLINICAL DATA: Left testicular pain for 3 days.

SCROTAL ULTRASOUND
DOPPLER ULTRASOUND OF THE TESTICLES
TECHNIQUE: Complete ultrasound examination of the testicles,
epididymis, and other scrotal structures was performed.  Color and
spectral Doppler ultrasound were also utilized to evaluate blood
flow to the testicles.

[Series 1: us scrotum · 0.08mm/px · 14 of 26 slices shown]
[im 1/26]
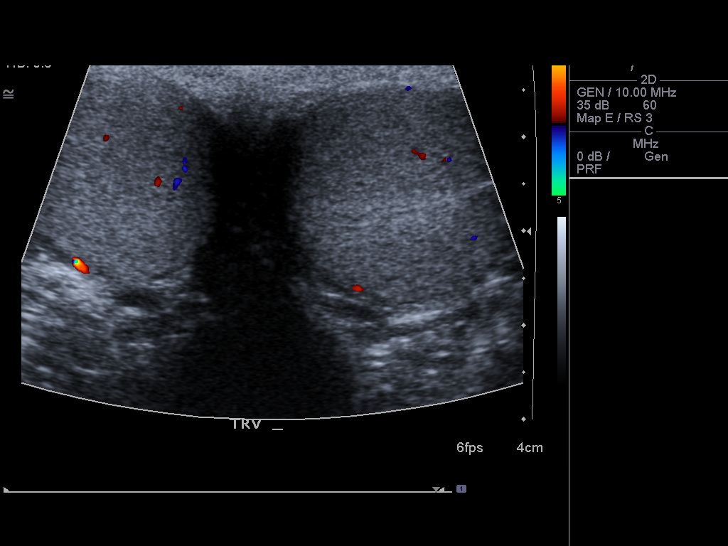
[im 3/26]
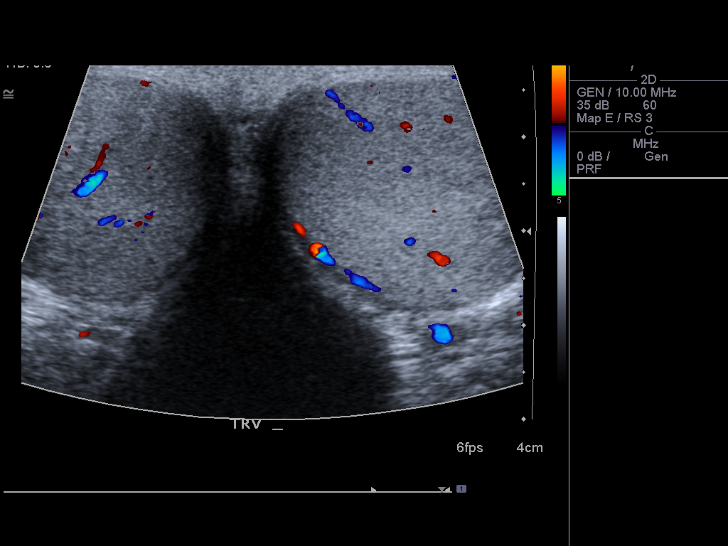
[im 5/26]
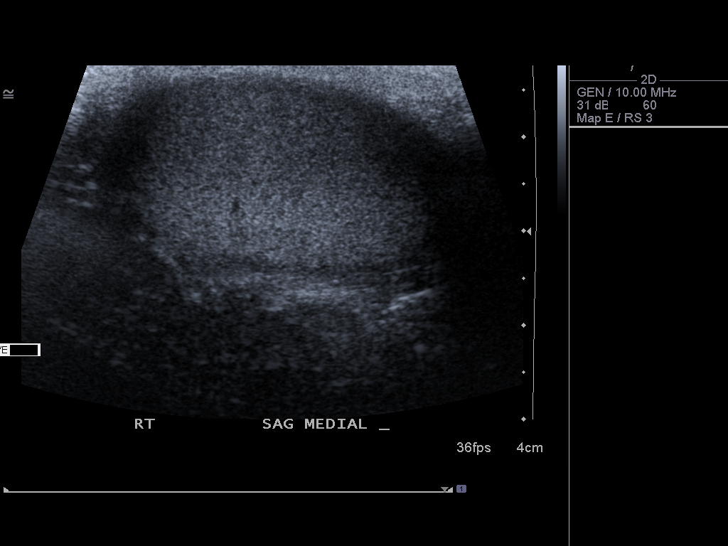
[im 7/26]
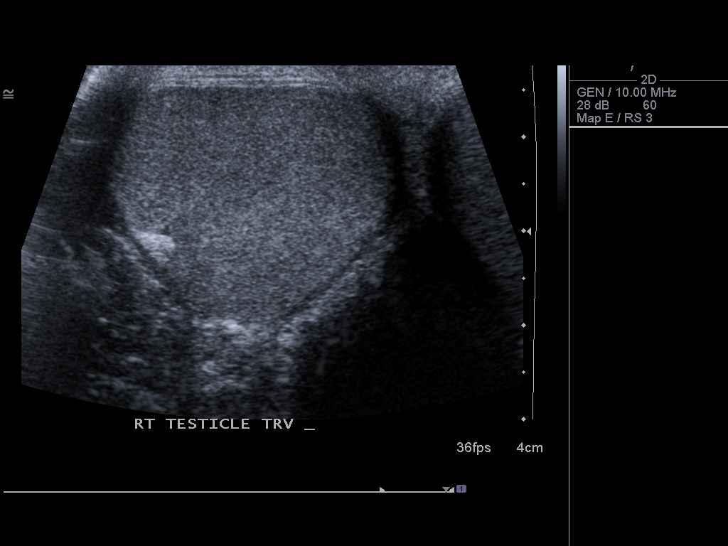
[im 9/26]
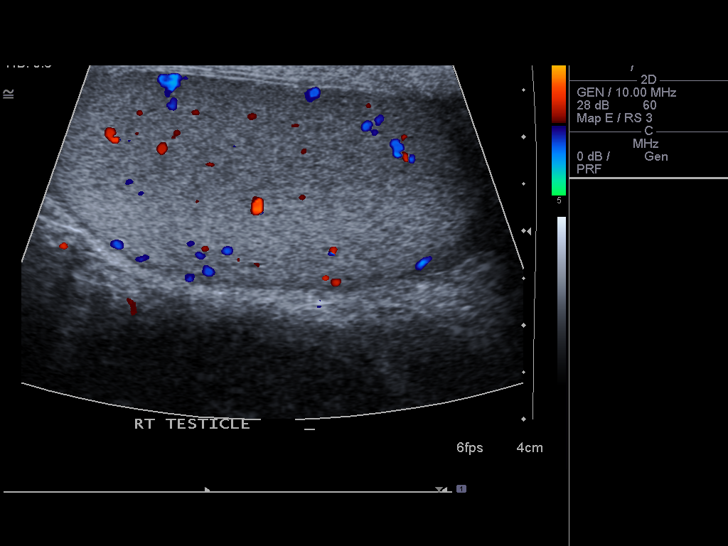
[im 10/26]
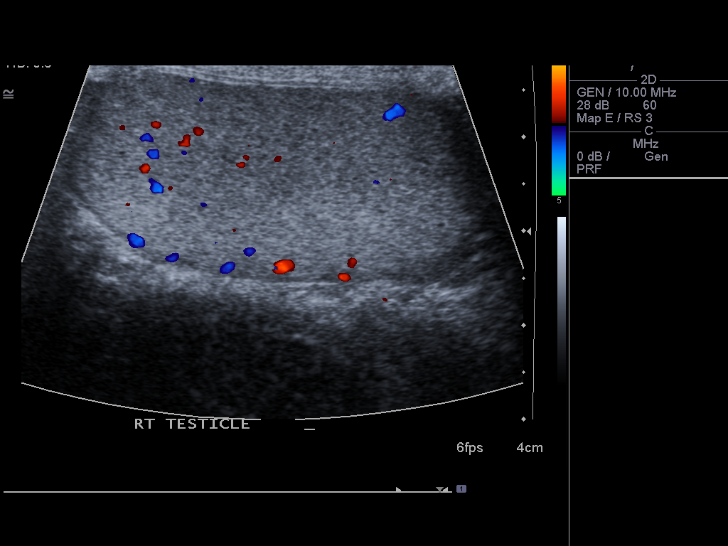
[im 12/26]
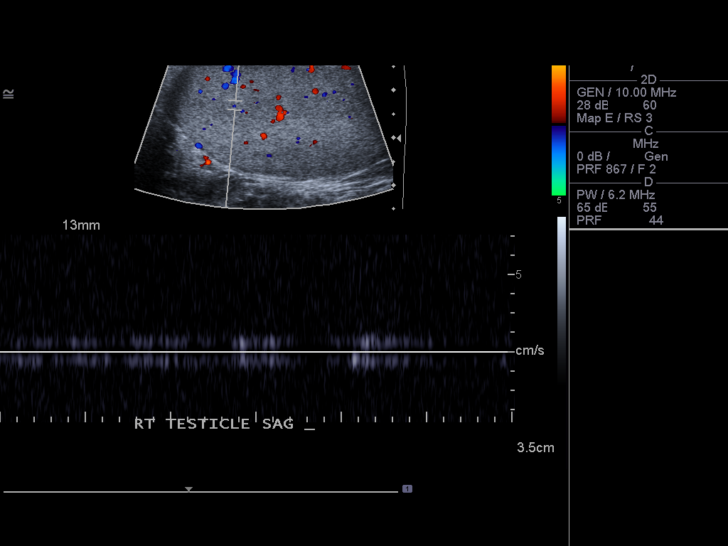
[im 14/26]
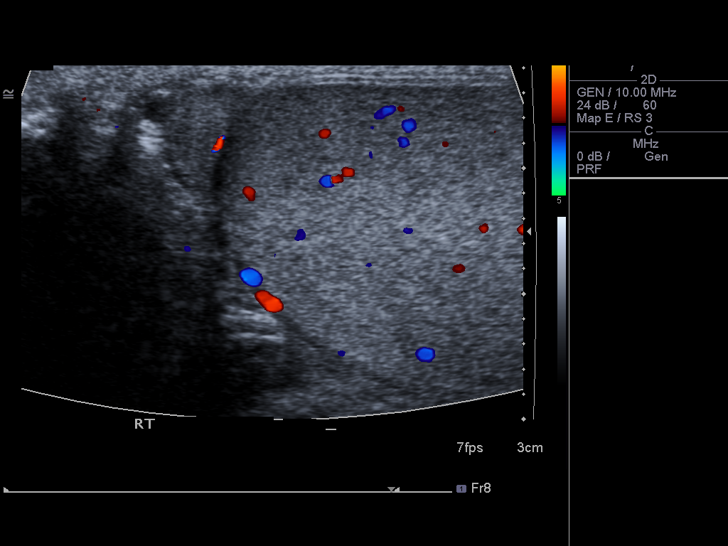
[im 16/26]
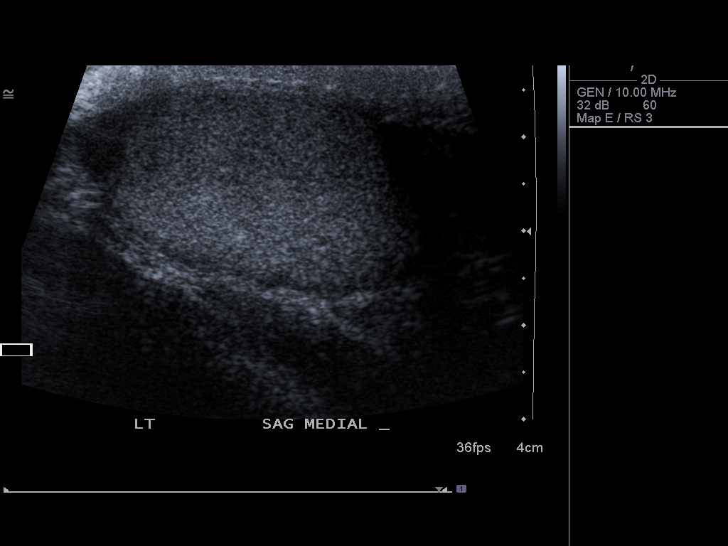
[im 17/26]
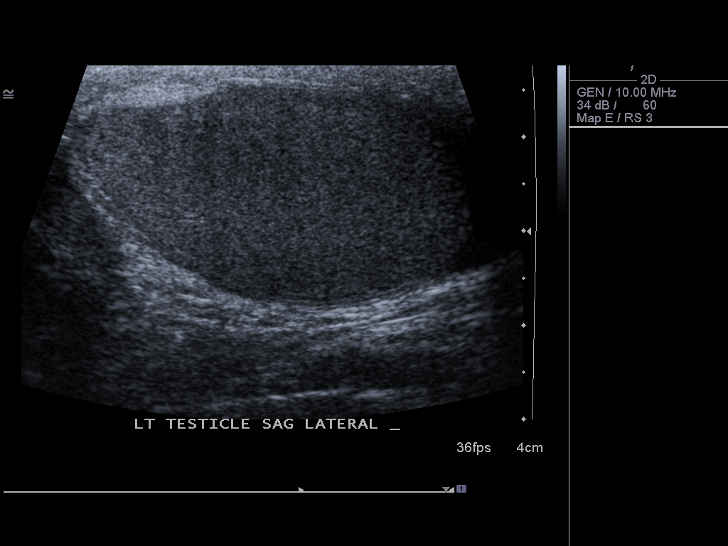
[im 19/26]
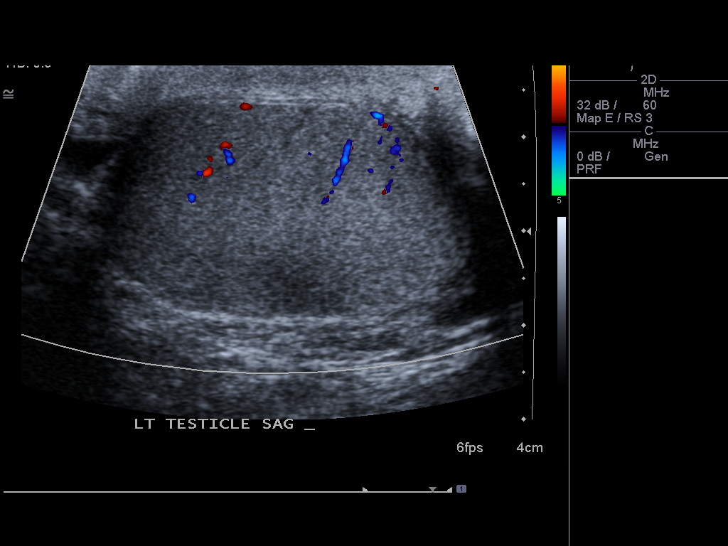
[im 21/26]
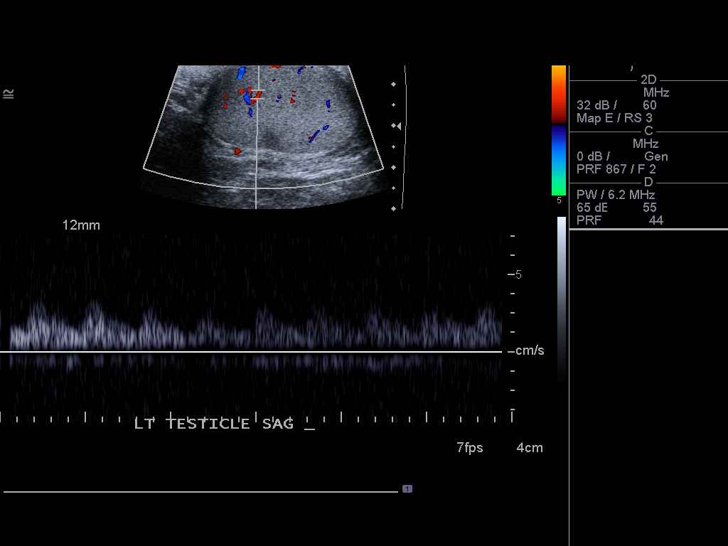
[im 23/26]
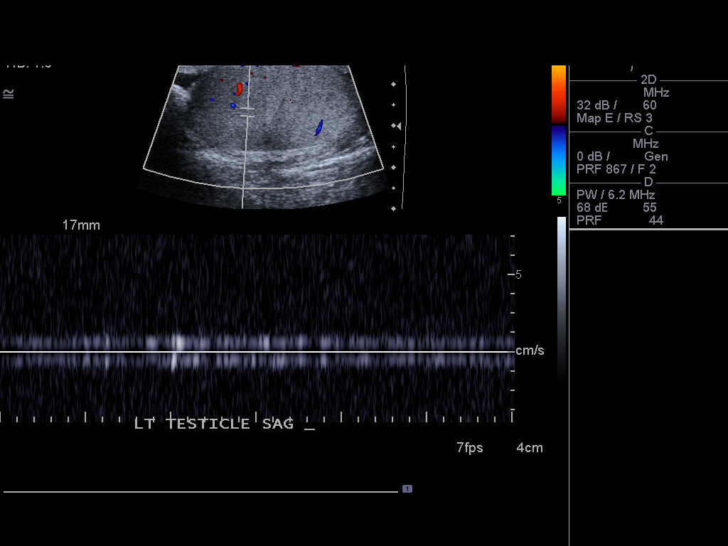
[im 26/26]
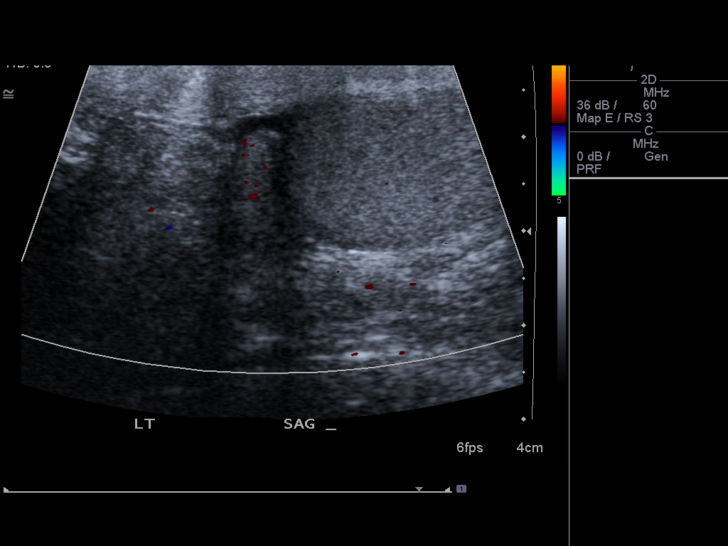

[14 of 25 positions shown; findings below may reference images not displayed]

FINDINGS: Right testis:  The right testis is of normal size and echotexture
measuring 4.4 x 2.5 x 3.0 cm.  Color Doppler flow is present and
symmetric to the left.

Left testis:  The right testis is of normal size and echotexture
measuring 4.4 x 2.5 x 3.0 cm.  Color Doppler flow is present and
symmetric to the right.

Right epididymis:  Normal in size and appearance.

Left epididymis:  Normal in size and appearance.

Hydrocele:  Absent

Varicocele:  Absent

Pulsed Doppler interrogation of both testes demonstrates low
resistance flow bilaterally.
IMPRESSION: 1.  Normal bilateral scrotal ultrasound and Doppler waveform
analysis.
